# Patient Record
Sex: Female | Born: 1975 | Race: Black or African American | Hispanic: No | Marital: Married | Smoking: Never smoker
Health system: Southern US, Community
[De-identification: ages and names within clinical notes are randomized; demographics above are authoritative.]

## PROBLEM LIST (undated history)

## (undated) DIAGNOSIS — I499 Cardiac arrhythmia, unspecified: Secondary | ICD-10-CM

## (undated) DIAGNOSIS — M1990 Unspecified osteoarthritis, unspecified site: Secondary | ICD-10-CM

## (undated) DIAGNOSIS — I10 Essential (primary) hypertension: Secondary | ICD-10-CM

## (undated) DIAGNOSIS — R519 Headache, unspecified: Secondary | ICD-10-CM

## (undated) DIAGNOSIS — D649 Anemia, unspecified: Secondary | ICD-10-CM

## (undated) DIAGNOSIS — Z973 Presence of spectacles and contact lenses: Secondary | ICD-10-CM

## (undated) DIAGNOSIS — J189 Pneumonia, unspecified organism: Secondary | ICD-10-CM

## (undated) DIAGNOSIS — L309 Dermatitis, unspecified: Secondary | ICD-10-CM

## (undated) DIAGNOSIS — G8929 Other chronic pain: Secondary | ICD-10-CM

## (undated) DIAGNOSIS — T7840XA Allergy, unspecified, initial encounter: Secondary | ICD-10-CM

## (undated) DIAGNOSIS — T8859XA Other complications of anesthesia, initial encounter: Secondary | ICD-10-CM

## (undated) DIAGNOSIS — R51 Headache: Secondary | ICD-10-CM

## (undated) HISTORY — DX: Dermatitis, unspecified: L30.9

## (undated) HISTORY — DX: Headache, unspecified: R51.9

## (undated) HISTORY — DX: Other chronic pain: G89.29

## (undated) HISTORY — PX: WISDOM TOOTH EXTRACTION: SHX21

## (undated) HISTORY — DX: Allergy, unspecified, initial encounter: T78.40XA

## (undated) HISTORY — DX: Headache: R51

## (undated) HISTORY — DX: Anemia, unspecified: D64.9

---

## 1998-12-15 ENCOUNTER — Other Ambulatory Visit: Admission: RE | Admit: 1998-12-15 | Discharge: 1998-12-15 | Payer: Self-pay | Admitting: Obstetrics and Gynecology

## 1999-01-23 ENCOUNTER — Other Ambulatory Visit: Admission: RE | Admit: 1999-01-23 | Discharge: 1999-01-23 | Payer: Self-pay | Admitting: Obstetrics and Gynecology

## 1999-06-08 ENCOUNTER — Other Ambulatory Visit: Admission: RE | Admit: 1999-06-08 | Discharge: 1999-06-08 | Payer: Self-pay | Admitting: Obstetrics and Gynecology

## 1999-07-29 ENCOUNTER — Emergency Department (HOSPITAL_COMMUNITY): Admission: EM | Admit: 1999-07-29 | Discharge: 1999-07-29 | Payer: Self-pay | Admitting: Emergency Medicine

## 1999-11-08 ENCOUNTER — Other Ambulatory Visit: Admission: RE | Admit: 1999-11-08 | Discharge: 1999-11-08 | Payer: Self-pay | Admitting: Obstetrics and Gynecology

## 1999-12-14 ENCOUNTER — Encounter (INDEPENDENT_AMBULATORY_CARE_PROVIDER_SITE_OTHER): Payer: Self-pay

## 1999-12-14 ENCOUNTER — Other Ambulatory Visit: Admission: RE | Admit: 1999-12-14 | Discharge: 1999-12-14 | Payer: Self-pay | Admitting: Obstetrics and Gynecology

## 2000-03-07 ENCOUNTER — Other Ambulatory Visit: Admission: RE | Admit: 2000-03-07 | Discharge: 2000-03-07 | Payer: Self-pay | Admitting: Obstetrics and Gynecology

## 2000-11-04 HISTORY — PX: WISDOM TOOTH EXTRACTION: SHX21

## 2002-01-15 ENCOUNTER — Other Ambulatory Visit: Admission: RE | Admit: 2002-01-15 | Discharge: 2002-01-15 | Payer: Self-pay | Admitting: Gynecology

## 2002-08-10 ENCOUNTER — Inpatient Hospital Stay (HOSPITAL_COMMUNITY): Admission: AD | Admit: 2002-08-10 | Discharge: 2002-08-13 | Payer: Self-pay | Admitting: Gynecology

## 2002-09-07 ENCOUNTER — Other Ambulatory Visit: Admission: RE | Admit: 2002-09-07 | Discharge: 2002-09-07 | Payer: Self-pay | Admitting: Gynecology

## 2003-08-09 ENCOUNTER — Other Ambulatory Visit: Admission: RE | Admit: 2003-08-09 | Discharge: 2003-08-09 | Payer: Self-pay | Admitting: Gynecology

## 2004-03-26 ENCOUNTER — Encounter: Admission: RE | Admit: 2004-03-26 | Discharge: 2004-03-26 | Payer: Self-pay | Admitting: Internal Medicine

## 2004-04-03 ENCOUNTER — Encounter: Admission: RE | Admit: 2004-04-03 | Discharge: 2004-04-03 | Payer: Self-pay | Admitting: Internal Medicine

## 2005-05-21 ENCOUNTER — Ambulatory Visit: Payer: Self-pay | Admitting: Internal Medicine

## 2005-06-06 ENCOUNTER — Other Ambulatory Visit: Admission: RE | Admit: 2005-06-06 | Discharge: 2005-06-06 | Payer: Self-pay | Admitting: Gynecology

## 2006-11-07 ENCOUNTER — Other Ambulatory Visit: Admission: RE | Admit: 2006-11-07 | Discharge: 2006-11-07 | Payer: Self-pay | Admitting: Gynecology

## 2009-05-25 ENCOUNTER — Ambulatory Visit: Payer: Self-pay | Admitting: Women's Health

## 2009-05-25 ENCOUNTER — Other Ambulatory Visit: Admission: RE | Admit: 2009-05-25 | Discharge: 2009-05-25 | Payer: Self-pay | Admitting: Gynecology

## 2009-05-25 ENCOUNTER — Encounter: Payer: Self-pay | Admitting: Women's Health

## 2010-05-04 LAB — HM PAP SMEAR: HM Pap smear: NORMAL

## 2010-09-25 ENCOUNTER — Inpatient Hospital Stay (HOSPITAL_COMMUNITY): Admission: AD | Admit: 2010-09-25 | Discharge: 2010-09-29 | Payer: Self-pay | Admitting: Obstetrics and Gynecology

## 2010-09-26 ENCOUNTER — Encounter: Payer: Self-pay | Admitting: Obstetrics and Gynecology

## 2010-09-28 ENCOUNTER — Encounter (INDEPENDENT_AMBULATORY_CARE_PROVIDER_SITE_OTHER): Payer: Self-pay | Admitting: Obstetrics and Gynecology

## 2011-01-16 LAB — COMPREHENSIVE METABOLIC PANEL
ALT: 34 U/L (ref 0–35)
AST: 54 U/L — ABNORMAL HIGH (ref 0–37)
Albumin: 2.4 g/dL — ABNORMAL LOW (ref 3.5–5.2)
Albumin: 2.4 g/dL — ABNORMAL LOW (ref 3.5–5.2)
Alkaline Phosphatase: 105 U/L (ref 39–117)
Alkaline Phosphatase: 128 U/L — ABNORMAL HIGH (ref 39–117)
Alkaline Phosphatase: 98 U/L (ref 39–117)
BUN: 1 mg/dL — ABNORMAL LOW (ref 6–23)
BUN: 1 mg/dL — ABNORMAL LOW (ref 6–23)
BUN: 4 mg/dL — ABNORMAL LOW (ref 6–23)
CO2: 23 mEq/L (ref 19–32)
CO2: 27 mEq/L (ref 19–32)
Calcium: 8.4 mg/dL (ref 8.4–10.5)
Chloride: 101 mEq/L (ref 96–112)
Chloride: 103 mEq/L (ref 96–112)
Chloride: 98 mEq/L (ref 96–112)
Creatinine, Ser: 0.47 mg/dL (ref 0.4–1.2)
GFR calc Af Amer: >60 mL/min (ref >60)
GFR calc non Af Amer: >60 mL/min (ref >60)
GFR calc non Af Amer: >60 mL/min (ref >60)
Glucose, Bld: 132 mg/dL — ABNORMAL HIGH (ref 70–99)
Glucose, Bld: 90 mg/dL (ref 70–99)
Potassium: 2.4 mEq/L — CL (ref 3.5–5.1)
Potassium: 2.9 mEq/L — ABNORMAL LOW (ref 3.5–5.1)
Potassium: 3.1 mEq/L — ABNORMAL LOW (ref 3.5–5.1)
Sodium: 137 mEq/L (ref 135–145)
Sodium: 140 mEq/L (ref 135–145)
Total Bilirubin: 0.8 mg/dL (ref 0.3–1.2)
Total Bilirubin: 0.8 mg/dL (ref 0.3–1.2)
Total Bilirubin: 1.2 mg/dL (ref 0.3–1.2)
Total Protein: 5 g/dL — ABNORMAL LOW (ref 6.0–8.3)

## 2011-01-16 LAB — CREATININE CLEARANCE, URINE, 24 HOUR
Collection Interval-CRCL: 24 hours
Creatinine Clearance: 193 mL/min — ABNORMAL HIGH (ref 75–115)
Creatinine, Urine: 59.1 mg/dL
Creatinine: 0.5 mg/dL (ref 0.4–1.2)

## 2011-01-16 LAB — CBC
HCT: 31.1 % — ABNORMAL LOW (ref 36.0–46.0)
HCT: 32.3 % — ABNORMAL LOW (ref 36.0–46.0)
Hemoglobin: 10.4 g/dL — ABNORMAL LOW (ref 12.0–15.0)
Hemoglobin: 10.8 g/dL — ABNORMAL LOW (ref 12.0–15.0)
Hemoglobin: 11.9 g/dL — ABNORMAL LOW (ref 12.0–15.0)
MCH: 29.4 pg (ref 26.0–34.0)
MCH: 29.4 pg (ref 26.0–34.0)
MCHC: 33.5 g/dL (ref 30.0–36.0)
MCHC: 33.5 g/dL (ref 30.0–36.0)
MCHC: 33.8 g/dL (ref 30.0–36.0)
MCV: 87.9 fL (ref 78.0–100.0)
MCV: 88 fL (ref 78.0–100.0)
MCV: 88 fL (ref 78.0–100.0)
Platelets: 111 10*3/uL — ABNORMAL LOW (ref 150–400)
Platelets: 78 10*3/uL — ABNORMAL LOW (ref 150–400)
Platelets: 80 10*3/uL — ABNORMAL LOW (ref 150–400)
RBC: 3.45 MIL/uL — ABNORMAL LOW (ref 3.87–5.11)
RBC: 3.46 MIL/uL — ABNORMAL LOW (ref 3.87–5.11)
RDW: 16.4 % — ABNORMAL HIGH (ref 11.5–15.5)
WBC: 10.5 10*3/uL (ref 4.0–10.5)
WBC: 6.1 10*3/uL (ref 4.0–10.5)

## 2011-01-16 LAB — LACTATE DEHYDROGENASE: LDH: 169 U/L (ref 94–250)

## 2011-01-16 LAB — PROTEIN, URINE, 24 HOUR: Protein, 24H Urine: 306 mg/d — ABNORMAL HIGH (ref 50–100)

## 2011-01-16 LAB — MRSA PCR SCREENING: MRSA by PCR: NEGATIVE

## 2011-01-16 LAB — ABO/RH: ABO/RH(D): A POS

## 2011-03-22 NOTE — Discharge Summary (Signed)
   NAME:  Carmen Simmons, Carmen Simmons                        ACCOUNT NO.:  [id number]   MEDICAL RECORD NO.:  [id number]                   PATIENT TYPE:  INP   LOCATION:  9325                                 FACILITY:  [facility]   PHYSICIAN:  Juan H. Lily Peer, M.D.             DATE OF BIRTH:  [birth year]   DATE OF ADMISSION:  08/10/2002  DATE OF DISCHARGE:  08/13/2002                                 DISCHARGE SUMMARY   DISCHARGE DIAGNOSES:  1. Intrauterine pregnancy at term.  2. Spontaneous onset of labor.   PROCEDURE:  Normal spontaneous vaginal delivery of viable infant over  midline episiotomy with second degree.   HISTORY OF PRESENT ILLNESS:  The patient is a 35 year old gravida 3, para 1-  0-1-1, LMP October 20, 2001, [facility] August 11, 2002.  Prenatal risk factors  include history of cryo surgery and a LEEP.   LABORATORIES:  Blood type A+.  Antibody screen negative.  RPR, HBSAG, HIV  nonreactive.  Sickle cell negative.   HOSPITAL COURSE:  The patient was admitted August 11, 2002 with spontaneous  onset of labor.  She progressed to complete dilatation.  Delivered an Apgar  8/9 female infant weighing 8 pounds 13 ounces over midline episiotomy with  repair of second degree laceration.  Postpartum course patient remained  afebrile, had no difficulty voiding.  Was able to be discharged in  satisfactory condition on her second postpartum day.  CBC:  Hematocrit 30.7,  hemoglobin 10.2, WBC 9.6, platelets 106,000.   DISPOSITION:  Follow up in six weeks.  Continue prenatal vitamins and iron.  Motrin and Tylox for pain.     Elwyn Lade . Hancock, N.P.                Gaetano Hawthorne. Lily Peer, M.D.    MKH/MEDQ  D:  09/06/2002  T:  09/07/2002  Job:  [id number]

## 2011-03-22 NOTE — H&P (Signed)
NAME:  Carmen Simmons, Carmen Simmons                        ACCOUNT NO.:  [id number]   MEDICAL RECORD NO.:  [id number]                   PATIENT TYPE:  INP   LOCATION:  9198                                 FACILITY:  [facility]   PHYSICIAN:  Timothy P. Fontaine, M.D.           DATE OF BIRTH:  1976/09/01   DATE OF ADMISSION:  08/10/2002  DATE OF DISCHARGE:                                HISTORY & PHYSICAL   CHIEF COMPLAINT:  Decreased amniotic fluid, pregnancy at term, large for  gestational age.   HISTORY OF PRESENT ILLNESS:  A 35 year old G80, P31, AB1 female at 5 to 59  weeks' gestation who presented complaining of decreased fetal movement.  The  patient underwent a nonstress test which was reactive, amniotic fluid check  which showed a less than third percentile amniotic fluid index at 5.9 cm and  estimated fetal weight at 4253 grams.  Of significance, the patient's  pregnancy has been uncomplicated to date.  Underwent vaginal delivery in  1999 of an 8 pound 13 ounce female, vaginal delivery with a reported  precipitous delivery.  The patient's prenatal course has been uncomplicated  to date.   PAST MEDICAL HISTORY:  None.   PAST SURGICAL HISTORY:  1. A cryosurgery of the cervix.  2. LEEP of the cervix.   ALLERGIES:  No known allergies.   REVIEW OF SYSTEMS:  Noncontributory.   SOCIAL HISTORY:  Noncontributory.   ADMISSION PHYSICAL EXAMINATION:  ABDOMEN:  Gravid, vertex fetus.  PELVIC:  Cervix tight fingertip 70%, minus 2 station, vertex presentation.   ASSESSMENT:  A 35 year old gravida 3, para 1, abortion 1 female 53 to [redacted]  weeks gestation, decreased fetal movement with reactive nonstress test, low  amniotic fluid index, estimated fetal weight at 4200 grams.  The patient has  prior delivery of 8 pound 13 ounce fetus with a reported precipitous  delivery that the doctor almost did not make it there in time.  I reviewed  with the patient and her husband my concern as far as fetal weight  and the  issues of shoulder dystocia.  The options of primary cesarean section versus  attempted vaginal delivery were reviewed, and I discussed in detail the  issues of shoulder dystocia to include the potential for devastating  outcomes.  The patient and her partner feel very strongly that they want an  attempt at a vaginal delivery, which I think given her prudent pelvis,  rapidity of past delivery and adequacy on physical exam is reasonable  although the patient understands that there are no guarantees and certainly  that the first indication of shoulder dystocia may be at the time of  delivery of the head.  I offered them cesarean section and they declined  accepting the risks of shoulder dystocia.  The patient and her partner also  understand that no attempts at assisted vaginal delivery would be made with  vacuum or forceps if  protraction or  dystocia is suspected, then we will proceed with cesarean  section at that time and they accept that plan.  Again, they clearly  understand the risks and the potential long-term sequelae of shoulder  dystocia, understand and accept this.                                               Timothy P. Audie Box, M.D.    TPF/MEDQ  D:  08/10/2002  T:  08/10/2002  Job:  [id number]

## 2011-06-06 ENCOUNTER — Ambulatory Visit: Payer: Self-pay | Admitting: Medical

## 2011-06-06 ENCOUNTER — Ambulatory Visit (INDEPENDENT_AMBULATORY_CARE_PROVIDER_SITE_OTHER): Payer: Commercial Managed Care - PPO | Admitting: Medical

## 2011-06-06 ENCOUNTER — Encounter: Payer: Self-pay | Admitting: Medical

## 2011-06-06 VITALS — BP 122/88 | HR 80 | Temp 97.8°F | Ht 70.0 in | Wt 215.5 lb

## 2011-06-06 DIAGNOSIS — M25561 Pain in right knee: Secondary | ICD-10-CM

## 2011-06-06 DIAGNOSIS — K429 Umbilical hernia without obstruction or gangrene: Secondary | ICD-10-CM

## 2011-06-06 DIAGNOSIS — M26609 Unspecified temporomandibular joint disorder, unspecified side: Secondary | ICD-10-CM

## 2011-06-06 DIAGNOSIS — M25569 Pain in unspecified knee: Secondary | ICD-10-CM

## 2011-06-06 NOTE — Progress Notes (Signed)
Subjective:   HPI  Carmen Simmons is a 35 y.o. female who presents as a new patient for multiple c/o.   She notes right knee pain and swelling x 1+ years.  Occasional the knee "goes out" on her. Worse with walking, standing in general. Denies hx/o injury or trauma. Using nothing for symptoms.  Denies remote hx/o injury or pop.  Using Absorbene junior topically without relief.   She c/o left ear pain x 1 wk.  Has hx/o ear infections.  Denies other URI symptoms.  She c/o possible umbiciluus hernia.  Occasionally has some mild discomfort at the umiblicius.  No other aggravating or relieving factors.  No other c/o.  The following portions of the patient's history were reviewed and updated as appropriate: allergies, current medications, past family history, past medical history, past social history, past surgical history and problem list.   Review of Systems Constitutional: denies fever, chills, sweats, unexpected weight change, anorexia, fatigue Allergy: no congestion, sneezing Dermatology: denies rash ENT: no runny nose, sore throat, hoarseness, sinus pain Cardiology: denies chest pain, palpitations, edema Respiratory: denies cough, shortness of breath, wheezing Gastroenterology: denies abdominal pain, nausea, vomiting, diarrhea, constipation Urology: denies dysuria, difficulty urinating, hematuria, urinary frequency, urgency Neurology: no headache, weakness, tingling, numbness     Objective:   Physical Exam  General appearance: alert, no distress, WD/WN, black female, overweight Skin: unremarkable HEENT: normocephalic, sclerae anicteric, PERRLA, EOMi, TMs normal, tender along left TMJ, nares patent, no discharge or erythema, pharynx normal Oral cavity: MMM, no lesions Neck: supple, no lymphadenopathy, no thyromegaly, no masses Heart: RRR, normal S1, S2, no murmurs Lungs: CTA bilaterally, no wheezes, rhonchi, or rales Abdomen: +bs, soft, 1.5 cm round umbilicus hernia, reducible, mildly  tender, otherwise non tender, non distended, no masses, no hepatomegaly, no splenomegaly Musculoskeletal:right knee tender along joint line, particularly anteromedial joint line, anterior drawer with slight laxity, pain with Mcmurray.  Otherwise no right knee laxity or pain with special tests.  Otherwise, LE normal ROM, non tender, no swelling, no obvious deformity Extremities: no edema, no cyanosis, no clubbing Pulses: 2+ symmetric, upper and lower extremities, normal cap refill Neurological: UE and LE normal strength and DTRs   Assessment :    Encounter Diagnoses  Name Primary?  . TMJ (temporomandibular joint syndrome) Yes  . Right knee pain   . Hernia, umbilical       Plan:   TMJ syndrome - advised soft foods/liquid diet for next few days, jaw rest, avoid heavy chewing, can use ice pack to the left TMJ, OTC NSAID  Right knee pain - exam seems suggestive of ACL laxity.  Refer to ortho for further eval and management.  Hernia, umbilical - discussed nature of umbilical hernias.  For now we will use watch and wait approach.  If worsening, consider general surgery consult.   Recheck in a few weeks after ortho eval.

## 2011-06-07 ENCOUNTER — Encounter: Payer: Self-pay | Admitting: Medical

## 2011-09-24 ENCOUNTER — Ambulatory Visit (INDEPENDENT_AMBULATORY_CARE_PROVIDER_SITE_OTHER): Payer: Commercial Managed Care - PPO | Admitting: Medical

## 2011-09-24 ENCOUNTER — Encounter: Payer: Self-pay | Admitting: Medical

## 2011-09-24 VITALS — BP 120/80 | HR 80 | Temp 98.0°F | Resp 16 | Wt 213.0 lb

## 2011-09-24 DIAGNOSIS — J069 Acute upper respiratory infection, unspecified: Secondary | ICD-10-CM | POA: Insufficient documentation

## 2011-09-24 DIAGNOSIS — J329 Chronic sinusitis, unspecified: Secondary | ICD-10-CM | POA: Insufficient documentation

## 2011-09-24 MED ORDER — AMOXICILLIN 500 MG PO TABS: ORAL_TABLET | ORAL | Status: DC

## 2011-09-24 NOTE — Patient Instructions (Signed)

## 2011-09-24 NOTE — Progress Notes (Signed)
Subjective:   HPI  Carmen Simmons is a 35 y.o. female who presents with few day hx/o sore throat, glands swollen, congestion in head, and she notes that she is prone to sinus infection.  Has some cough.   Orangish mucous from cough.  Using some [facility] Theraflu and cough drops.  Son recently had bronchitis.  She is a nonsmoker.  No other aggravating or relieving factors.    No other c/o.  The following portions of the patient's history were reviewed and updated as appropriate: allergies, current medications, past family history, past medical history, past social history and past surgical history.  Past Medical History  Diagnosis Date  . Anemia   . Allergy   . Eczema   . Chronic headache     Review of Systems Constitutional: -fever, -chills, -sweats, -unexpected -weight change,-fatigue ENT: -runny nose, -ear pain, +sore throat Cardiology:  -chest pain, -palpitations, -edema Respiratory: +cough, +shortness of breath, -wheezing Gastroenterology: -abdominal pain, -nausea, -vomiting, -diarrhea, -constipation Hematology: -bleeding or bruising problems Musculoskeletal: -arthralgias, -myalgias, -joint swelling, -back pain Ophthalmology: -vision changes Urology: -dysuria, -difficulty urinating, -hematuria, -urinary frequency, -urgency Neurology: +headache, -weakness, -tingling, -numbness    Objective:   Filed Vitals:   09/24/11 0814  BP: 120/80  Pulse: 80  Temp: 98 F (36.7 C)  Resp: 16    General appearance: Alert, WD/WN, no distress                             Skin: warm, no rash                           Head: +mild maxillary and frontal sinus tenderness,                            Eyes: conjunctiva normal, corneas clear, PERRLA                            Ears: pearly TMs, external ear canals normal                          Nose: septum midline, turbinates swollen, with erythema and clear discharge             Mouth/throat: MMM, tongue normal, mild pharyngeal erythema              Neck: supple, no adenopathy, no thyromegaly, nontender                          Heart: RRR, normal S1, S2, no murmurs                         Lungs: CTA bilaterally, no wheezes, rales, or rhonchi      Assessment and Plan:   Encounter Diagnoses  Name Primary?  . URI (upper respiratory infection) Yes  . Sinusitis    Discussed symptomatic relief, nasal saline, and advised that current eval suggests viral etiology.   If much worse in 3-5 days, particularly fever, worse thick green brown discharge, worse sinus pressure/teeth pain, then can consider beginning Amoxicillin.  Script printed.  For now though, advised to treat as discussed for viral etiology.  Call or return if worse or not improving.

## 2012-01-03 DIAGNOSIS — N930 Postcoital and contact bleeding: Secondary | ICD-10-CM | POA: Insufficient documentation

## 2012-03-13 ENCOUNTER — Ambulatory Visit (INDEPENDENT_AMBULATORY_CARE_PROVIDER_SITE_OTHER): Payer: Commercial Managed Care - PPO | Admitting: Medical

## 2012-03-13 ENCOUNTER — Encounter: Payer: Self-pay | Admitting: Medical

## 2012-03-13 VITALS — BP 110/80 | HR 88 | Temp 98.2°F | Resp 16 | Wt 220.0 lb

## 2012-03-13 DIAGNOSIS — J329 Chronic sinusitis, unspecified: Secondary | ICD-10-CM

## 2012-03-13 MED ORDER — MOMETASONE FUROATE 50 MCG/ACT NA SUSP: 2.0000 | Freq: Every day | NASAL | Status: DC

## 2012-03-13 MED ORDER — AMOXICILLIN 875 MG PO TABS: 875.0000 mg | ORAL_TABLET | Freq: Two times a day (BID) | ORAL | Status: AC

## 2012-03-13 NOTE — Patient Instructions (Signed)

## 2012-03-13 NOTE — Progress Notes (Signed)
Subjective:  Carmen Simmons is a 36 y.o. female who presents for massive sinus pressure in head, bad cold symptoms, lots of post nasal drainage, and irritated throat, bad x all week.  She notes low grade fever, some ear pressure, feels weird, some popping.  Some cough due to drainage.  Lot of phlegm in throat.  Using some sinus and congestion tablets, but last 3 days not helping.   +sick contacts - son, 2 wk ago with strep.  No other aggravating or relieving factors.  No other c/o.  Past Medical History  Diagnosis Date  . Anemia   . Allergy   . Eczema   . Chronic headache    ROS  GI:no NVD, otherwise as noted in HPI   Objective:   Filed Vitals:   03/13/12 1510  BP: 110/80  Pulse: 88  Temp: 98.2 F (36.8 C)  Resp: 16    General appearance: Alert, WD/WN, no distress                             Skin: warm, no rash                           Head: +frontal sinus tenderness,                            Eyes: conjunctiva normal, corneas clear, PERRLA                            Ears: pearly TMs, external ear canals normal                          Nose: septum midline, turbinates swollen, R>L, with erythema and clear discharge             Mouth/throat: MMM, tongue normal, mild pharyngeal erythema                           Neck: supple, no adenopathy, no thyromegaly, nontender                          Heart: RRR, normal S1, S2, no murmurs                         Lungs: CTA bilaterally, no wheezes, rales, or rhonchi      Assessment and Plan:   Encounter Diagnosis  Name Primary?  . Sinusitis Yes   Prescription given for Amoxicillin, sample Nasonex given.  Can use OTC Robitussin for congestion.  Tylenol or Ibuprofen OTC for fever and malaise.  Discussed symptomatic relief, nasal saline, and call or return if worse or not improving in 2-3 days.

## 2012-04-15 ENCOUNTER — Ambulatory Visit (INDEPENDENT_AMBULATORY_CARE_PROVIDER_SITE_OTHER): Payer: Commercial Managed Care - PPO | Admitting: Medical

## 2012-04-15 ENCOUNTER — Encounter: Payer: Self-pay | Admitting: Medical

## 2012-04-15 VITALS — BP 120/80 | HR 64 | Temp 98.4°F | Resp 14 | Wt 214.0 lb

## 2012-04-15 DIAGNOSIS — M76899 Other specified enthesopathies of unspecified lower limb, excluding foot: Secondary | ICD-10-CM

## 2012-04-15 DIAGNOSIS — M7050 Other bursitis of knee, unspecified knee: Secondary | ICD-10-CM

## 2012-04-15 MED ORDER — NAPROXEN SODIUM ER 500 MG PO TB24: 500.0000 mg | ORAL_TABLET | Freq: Every day | ORAL | Status: DC

## 2012-04-15 NOTE — Progress Notes (Signed)
Subjective: Here for c/o right knee pain.  I saw her last year for right knee pain and she ended up seeing ortho for evaluation, diagnosed with patellofemoral syndrome, was referred for physical therapy.   She went a few times and things seemed to get better.  In general she has not been exercising.  She was playing on the floor on her knees with her twin 1.5 year olds Saturday, but denies a pop, injury, or trauma.  She started seeing a knot and swelling in the right knee on Saturday.  She went for a run Sunday without much problem.  The knot didn't change thought.  She went for run yesterday too.  She initially had swelling and knot, but yesterday started getting pain too.  She denies fall, injury, trauma.  She denies swelling, redness, no numbness or tingling.     Past Medical History  Diagnosis Date  . Anemia   . Allergy   . Eczema   . Chronic headache    Objective: Gen: wd, wn, nad, AA female Skin: no erythema or ecchymosis MSK: obvious raised linear mass along the right anteromedial knee and seems to run along the sartorius muscle suggesting inflamed tendon.  The anterior knee along the patellar tendon is swollen, tender, and there is a small effusion.  Exam suggest bursitis.  Otherwise normal knee ROM, no other deformity or laxity.  Rest of LE exam unremarkable. Pulses: LE normal pulses Neuro: normal strength and sensation of LE   Assessment: Encounter Diagnosis  Name Primary?  . Patellar bursitis Yes    Plan:  Discussed case with Dr. Lynelle Doctor who also examined the patient.  Discussed diagnosis, causes, treatment.  Advised RICE - rest, ice, compression with knee sleeve, and ice. Begin samples of Naprelan once daily.  If not improving in 7-10 days, then call or return.  reviewed ortho records from last year regarding right patella femoral syndrome.

## 2012-06-08 ENCOUNTER — Telehealth: Payer: Self-pay | Admitting: Medical

## 2012-06-08 NOTE — Telephone Encounter (Signed)
Take some form of antiinflammatory tonight such as Aleve or Ibuprofen, elevate the leg, and use ice pack for 20 minuets at at time.  Work her in [employer] account executive.  Sounds like she may ultimately need to see ortho.

## 2013-01-06 ENCOUNTER — Ambulatory Visit (INDEPENDENT_AMBULATORY_CARE_PROVIDER_SITE_OTHER): Payer: Commercial Managed Care - PPO | Admitting: Medical

## 2013-01-06 ENCOUNTER — Encounter: Payer: Self-pay | Admitting: Medical

## 2013-01-06 VITALS — BP 120/80 | HR 68 | Temp 98.3°F | Resp 16 | Wt 206.0 lb

## 2013-01-06 DIAGNOSIS — R109 Unspecified abdominal pain: Secondary | ICD-10-CM

## 2013-01-06 DIAGNOSIS — M549 Dorsalgia, unspecified: Secondary | ICD-10-CM

## 2013-01-06 DIAGNOSIS — IMO0002 Reserved for concepts with insufficient information to code with codable children: Secondary | ICD-10-CM

## 2013-01-06 LAB — POCT URINALYSIS DIPSTICK
Bilirubin, UA: NEGATIVE
Leukocytes, UA: NEGATIVE
Nitrite, UA: NEGATIVE
Urobilinogen, UA: NEGATIVE
pH, UA: 7

## 2013-01-06 MED ORDER — OMEPRAZOLE 40 MG PO CPDR: DELAYED_RELEASE_CAPSULE | ORAL | Status: DC

## 2013-01-06 NOTE — Patient Instructions (Signed)
Begin Prilosec/Omeprazole once daily 45 minutes prior to exercise to help epigastric pain and abdominal pain and heartburn symptom  Continue the probiotic, make sure you are getting fiber in the diet such as from whole grain, fruits, plenty of water daily, and consider a fiber supplement such as psyllium, flax seed or chia seed.  You can also get OTC fiber supplement tablets.    Consider Miralax powder, mixing 1 scoop in a beverage daily.  This is also available OTC.    If your symptoms persist, then we will consider imaging.  However, by the time you see gynecology, they can also help [place] address this if they decide to image your abdomen.

## 2013-01-06 NOTE — Progress Notes (Signed)
Subjective:  Carmen Simmons is a 37 y.o. female who presents with abdominal pain, thinks she has a hernia left of belly button.  She notes localized pain left of umbilicus, but no bulging.   Sometimes pain worse with standing or exercise.  Has had off and on similar since 2003, thinks prior doctor said she may have hernia.  She has been wearing a girdle support.   Denies any recent imaging of her abdomen.  Denies fever, weight loss or gain, nausea, vomiting, diarrhea, constipation, no blood or mucous in stool, no GU symptoms.  She does report at times discomfort after intercourse, occasionally has blood spotting or tissue like material from vagina after sex.  Has mirena.   No vaginal discharge.  Been bloated with gas makes this worse.    Has some heartburn, epigastric pain at times.  No other aggravating or relieving factors.    No other c/o.  The following portions of the patient's history were reviewed and updated as appropriate: allergies, current medications, past family history, past medical history, past social history, past surgical history and problem list.  ROS Otherwise as in subjective above  Past Medical History  Diagnosis Date  . Anemia   . Allergy   . Eczema   . Chronic headache     Objective: Physical Exam  Vital signs reviewed  General appearance: alert, no distress, WD/WN, AA female Oral cavity: MMM, no lesions Neck: supple, no lymphadenopathy, no thyromegaly, no masses Heart: RRR, normal S1, S2, no murmurs Lungs: CTA bilaterally, no wheezes, rhonchi, or rales Back : nontender, normal ROM Abdomen: +bs, soft, generalized tenderness, no obvious hernia, non distended, no masses, no hepatomegaly, no splenomegaly Pulses: 2+ radial pulses, 2+ pedal pulses, normal cap refill Ext: no edema   Assessment: Encounter Diagnoses  Name Primary?  . Abdominal pain, unspecified site Yes  . Back pain   . Dyspareunia     Plan: Discussed symptoms, concerns.   differential can  include GERD, gynecological problem, cholecystitis, constipation, mass, adhesions, etc.  She is tender throughout.  She has some possible GERD symptoms.  Discussed treatment and lifestyle changes to help with GERD and constipation including avoidance of GERD triggers in diet, increase water and fiber intake, begin trial of Omeprazole, and if not improving in 1-2 wk, consider recheck and also f/u with gynecologist regarding dyspareunia.   Follow up: 2wk

## 2013-01-19 ENCOUNTER — Other Ambulatory Visit: Payer: Self-pay | Admitting: Physician Assistant

## 2013-01-19 DIAGNOSIS — R109 Unspecified abdominal pain: Secondary | ICD-10-CM

## 2013-01-21 ENCOUNTER — Ambulatory Visit
Admission: RE | Admit: 2013-01-21 | Discharge: 2013-01-21 | Disposition: A | Discharge status: Home / Self Care | Payer: Commercial Managed Care - PPO | Source: Ambulatory Visit | Attending: Physician Assistant | Admitting: Physician Assistant

## 2013-01-21 DIAGNOSIS — R109 Unspecified abdominal pain: Secondary | ICD-10-CM

## 2013-01-28 ENCOUNTER — Other Ambulatory Visit: Payer: Self-pay | Admitting: Family Medicine

## 2013-01-30 LAB — PAP IG, CT-NG, RFX HPV ASCU
Chlamydia Probe Amp: NEGATIVE
GC Probe Amp: NEGATIVE

## 2013-02-01 DIAGNOSIS — E669 Obesity, unspecified: Secondary | ICD-10-CM | POA: Insufficient documentation

## 2013-02-08 ENCOUNTER — Ambulatory Visit (INDEPENDENT_AMBULATORY_CARE_PROVIDER_SITE_OTHER): Payer: Commercial Managed Care - PPO | Admitting: Surgery

## 2013-02-08 ENCOUNTER — Encounter (INDEPENDENT_AMBULATORY_CARE_PROVIDER_SITE_OTHER): Payer: Self-pay | Admitting: Surgery

## 2013-02-08 ENCOUNTER — Other Ambulatory Visit (INDEPENDENT_AMBULATORY_CARE_PROVIDER_SITE_OTHER): Payer: Self-pay

## 2013-02-08 VITALS — BP 128/86 | HR 68 | Resp 14 | Ht 70.0 in | Wt 210.2 lb

## 2013-02-08 DIAGNOSIS — M6200 Separation of muscle (nontraumatic), unspecified site: Secondary | ICD-10-CM

## 2013-02-08 DIAGNOSIS — M6208 Separation of muscle (nontraumatic), other site: Secondary | ICD-10-CM

## 2013-02-08 DIAGNOSIS — K429 Umbilical hernia without obstruction or gangrene: Secondary | ICD-10-CM | POA: Insufficient documentation

## 2013-02-08 NOTE — Progress Notes (Signed)
Patient ID: Carmen Simmons, female   DOB: 06-07-1976, 37 y.o.   MRN: [id number]  No chief complaint on file.   HPI Carmen Simmons is a 37 y.o. female.  Patient sent at the request of Kristian Covey PA for abdominal pain. She's had progressive dull abdominal pain above her umbilicus made worse with exertion and improved by wearing an abdominal support. Pain gets worse at the end of day and is becoming more common. She has a history of an umbilical hernia since 2005. She gave birth to twins 2-1/2 years ago. No nausea, vomiting or diarrhea noted. No food intolerance noted. HPI  Past Medical History  Diagnosis Date  . Anemia   . Allergy   . Eczema   . Chronic headache     Past Surgical History  Procedure Laterality Date  . Cesarean section    . Wisdom tooth extraction      Family History  Problem Relation Age of Onset  . Hypertension Mother   . Hypertension Mother     Social History History  Substance Use Topics  . Smoking status: Never Smoker   . Smokeless tobacco: Never Used  . Alcohol Use: 0.0 oz/week    .5 drink(s) per week     Comment: less than 1 a week    No Known Allergies  Current Outpatient Prescriptions  Medication Sig Dispense Refill  . Levonorgestrel (MIRENA IU) by Intrauterine route.        . mometasone (NASONEX) 50 MCG/ACT nasal spray Place 2 sprays into the nose daily.  17 g  12  . Multiple Vitamin (MULTIVITAMIN) tablet Take 1 tablet by mouth daily.        Marland Kitchen omeprazole (PRILOSEC) 40 MG capsule Take 1 tablet 45 minutes prior to breakfast daily  30 capsule  0   No current facility-administered medications for this visit.    Review of Systems Review of Systems  Constitutional: Negative.   HENT: Negative.   Eyes: Negative.   Respiratory: Negative.   Cardiovascular: Negative.   Gastrointestinal: Positive for abdominal pain.  Endocrine: Negative.   Genitourinary: Negative.   Allergic/Immunologic: Negative.   Neurological: Negative.   Hematological:  Negative.   Psychiatric/Behavioral: Negative.     Blood pressure 128/86, pulse 68, resp. rate 14, height 5\' 10"  (1.778 m), weight 210 lb 3.2 oz (95.346 kg).  Physical Exam Physical Exam  Constitutional: She is oriented to person, place, and time. She appears well-developed and well-nourished.  HENT:  Head: Normocephalic and atraumatic.  Eyes: EOM are normal. Pupils are equal, round, and reactive to light.  Neck: Normal range of motion. Neck supple.  Cardiovascular: Normal rate and regular rhythm.   Pulmonary/Chest: Effort normal and breath sounds normal.  Abdominal: Soft. Bowel sounds are normal. She exhibits no distension. There is tenderness.    Musculoskeletal: Normal range of motion.  Neurological: She is alert and oriented to person, place, and time.  Skin: Skin is warm and dry.  Psychiatric: She has a normal mood and affect. Her behavior is normal. Thought content normal.    Data Reviewed ABDOMINAL CT WITH CONTRAST  Routine spiral CT of the abdomen was performed. 100 cc Omnipaque intravenous contrast and oral contrast were administered.  The abdominal parenchymal organs are normal in appearance. There is no evidence of masses, adenopathy, inflammatory process, or abnormal fluid collections. Very small umbilical hernia measures 1.3 cm wide containing fat only.  IMPRESSION  1. Minimal umbilical hernia containing fat only.  2. Otherwise normal.  PELVIC CT  WITH CONTRAST  Routine spiral CT of the pelvis was performed. Omnipaque intravenous contrast and oral contrast were administered.  The pelvic structures are normal in appearance. There is no evidence of masses, adenopathy, inflammatory process, or abnormal fluid collections.  IMPRESSION  Normal pelvis CT.   Assessment    Diastases recti with pain  Small umbilical hernia present since 2005 nontender    Plan    Need plastic surgery evaluation since she is having continued abdominal discomfort which I feel is from her  rectus diastases. Her hernia small and nontender. I doubt this is causing her discomfort since most of her pain is superior to this area in the area of diastases. She is wearing a support which helps. We'll get plastic surgery input and hopefully go from there. The hernia small to be repaired primarily.       Priscila Bean A. 02/08/2013, 11:02 AM

## 2013-02-08 NOTE — Patient Instructions (Signed)
Will refer to plastic surgery for opinion on diastasis recti.  Small umbilical hernia seems unchanged compared to 2005.

## 2013-02-17 ENCOUNTER — Telehealth (INDEPENDENT_AMBULATORY_CARE_PROVIDER_SITE_OTHER): Payer: Self-pay | Admitting: General Surgery

## 2013-02-17 NOTE — Telephone Encounter (Signed)
Spoke with patient she is aware of appt with Dr Kelly Splinter 03/02/13 845 am

## 2013-03-26 DIAGNOSIS — M6208 Separation of muscle (nontraumatic), other site: Secondary | ICD-10-CM | POA: Insufficient documentation

## 2013-08-18 ENCOUNTER — Other Ambulatory Visit: Payer: Self-pay | Admitting: Physician Assistant

## 2013-08-18 ENCOUNTER — Ambulatory Visit
Admission: RE | Admit: 2013-08-18 | Discharge: 2013-08-18 | Disposition: A | Discharge status: Home / Self Care | Payer: Commercial Managed Care - PPO | Source: Ambulatory Visit | Attending: Physician Assistant | Admitting: Physician Assistant

## 2013-08-18 DIAGNOSIS — R52 Pain, unspecified: Secondary | ICD-10-CM

## 2013-09-02 ENCOUNTER — Other Ambulatory Visit: Payer: Self-pay | Admitting: Family Medicine

## 2013-09-02 DIAGNOSIS — M25561 Pain in right knee: Secondary | ICD-10-CM

## 2013-09-06 ENCOUNTER — Other Ambulatory Visit: Payer: Commercial Managed Care - PPO

## 2015-06-03 ENCOUNTER — Emergency Department (HOSPITAL_COMMUNITY): Payer: Commercial Managed Care - PPO

## 2015-06-03 ENCOUNTER — Encounter (HOSPITAL_COMMUNITY): Payer: Self-pay

## 2015-06-03 ENCOUNTER — Emergency Department (HOSPITAL_COMMUNITY)
Admission: EM | Admit: 2015-06-03 | Discharge: 2015-06-03 | Disposition: A | Discharge status: Home / Self Care | Payer: Commercial Managed Care - PPO | Attending: Emergency Medicine | Admitting: Emergency Medicine

## 2015-06-03 DIAGNOSIS — Z3202 Encounter for pregnancy test, result negative: Secondary | ICD-10-CM | POA: Insufficient documentation

## 2015-06-03 DIAGNOSIS — G8929 Other chronic pain: Secondary | ICD-10-CM | POA: Insufficient documentation

## 2015-06-03 DIAGNOSIS — R197 Diarrhea, unspecified: Secondary | ICD-10-CM | POA: Insufficient documentation

## 2015-06-03 DIAGNOSIS — N83201 Unspecified ovarian cyst, right side: Secondary | ICD-10-CM

## 2015-06-03 DIAGNOSIS — Z862 Personal history of diseases of the blood and blood-forming organs and certain disorders involving the immune mechanism: Secondary | ICD-10-CM | POA: Diagnosis not present

## 2015-06-03 DIAGNOSIS — Z79899 Other long term (current) drug therapy: Secondary | ICD-10-CM | POA: Insufficient documentation

## 2015-06-03 DIAGNOSIS — R630 Anorexia: Secondary | ICD-10-CM | POA: Diagnosis not present

## 2015-06-03 DIAGNOSIS — N8320 Unspecified ovarian cysts: Secondary | ICD-10-CM | POA: Insufficient documentation

## 2015-06-03 DIAGNOSIS — R1110 Vomiting, unspecified: Secondary | ICD-10-CM | POA: Diagnosis not present

## 2015-06-03 DIAGNOSIS — R1031 Right lower quadrant pain: Secondary | ICD-10-CM | POA: Diagnosis present

## 2015-06-03 DIAGNOSIS — Z872 Personal history of diseases of the skin and subcutaneous tissue: Secondary | ICD-10-CM | POA: Insufficient documentation

## 2015-06-03 LAB — COMPREHENSIVE METABOLIC PANEL
ALK PHOS: 49 U/L (ref 38–126)
ALT: 17 U/L (ref 14–54)
AST: 16 U/L (ref 15–41)
Albumin: 4.3 g/dL (ref 3.5–5.0)
Anion gap: 7 (ref 5–15)
BILIRUBIN TOTAL: 0.6 mg/dL (ref 0.3–1.2)
BUN: 12 mg/dL (ref 6–20)
CHLORIDE: 108 mmol/L (ref 101–111)
CO2: 23 mmol/L (ref 22–32)
Calcium: 8.7 mg/dL — ABNORMAL LOW (ref 8.9–10.3)
Creatinine, Ser: 0.48 mg/dL (ref 0.44–1.00)
GFR calc non Af Amer: >60 mL/min (ref >60)
Glucose, Bld: 95 mg/dL (ref 65–99)
POTASSIUM: 3.7 mmol/L (ref 3.5–5.1)
Sodium: 138 mmol/L (ref 135–145)
TOTAL PROTEIN: 7.9 g/dL (ref 6.5–8.1)

## 2015-06-03 LAB — LIPASE, BLOOD: Lipase: 16 U/L — ABNORMAL LOW (ref 22–51)

## 2015-06-03 LAB — CBC WITH DIFFERENTIAL/PLATELET
BASOS PCT: 0 % (ref 0–1)
Basophils Absolute: 0 10*3/uL (ref 0.0–0.1)
Eosinophils Absolute: 0 10*3/uL (ref 0.0–0.7)
Eosinophils Relative: 1 % (ref 0–5)
HCT: 39.8 % (ref 36.0–46.0)
HEMOGLOBIN: 13.1 g/dL (ref 12.0–15.0)
Lymphocytes Relative: 30 % (ref 12–46)
Lymphs Abs: 1.6 10*3/uL (ref 0.7–4.0)
MCH: 28.1 pg (ref 26.0–34.0)
MCHC: 32.9 g/dL (ref 30.0–36.0)
MCV: 85.4 fL (ref 78.0–100.0)
MONOS PCT: 6 % (ref 3–12)
Monocytes Absolute: 0.3 10*3/uL (ref 0.1–1.0)
NEUTROS ABS: 3.4 10*3/uL (ref 1.7–7.7)
NEUTROS PCT: 63 % (ref 43–77)
Platelets: 141 10*3/uL — ABNORMAL LOW (ref 150–400)
RBC: 4.66 MIL/uL (ref 3.87–5.11)
RDW: 12.8 % (ref 11.5–15.5)
WBC: 5.4 10*3/uL (ref 4.0–10.5)

## 2015-06-03 LAB — I-STAT CG4 LACTIC ACID, ED: Lactic Acid, Venous: 0.54 mmol/L (ref 0.5–2.0)

## 2015-06-03 LAB — POC URINE PREG, ED: Preg Test, Ur: NEGATIVE

## 2015-06-03 MED ORDER — SODIUM CHLORIDE 0.9 % IV BOLUS (SEPSIS)
1000.0000 mL | Freq: Once | INTRAVENOUS | Status: AC
  Administered 2015-06-03: 1000 mL via INTRAVENOUS

## 2015-06-03 MED ORDER — ONDANSETRON HCL 4 MG/2ML IJ SOLN
4.0000 mg | Freq: Once | INTRAMUSCULAR | Status: AC
  Administered 2015-06-03: 4 mg via INTRAVENOUS
  Filled 2015-06-03: qty 2

## 2015-06-03 MED ORDER — IOHEXOL 300 MG/ML  SOLN
100.0000 mL | Freq: Once | INTRAMUSCULAR | Status: AC | PRN
  Administered 2015-06-03: 100 mL via INTRAVENOUS

## 2015-06-03 MED ORDER — HYDROCODONE-ACETAMINOPHEN 5-325 MG PO TABS: 1.0000 | ORAL_TABLET | ORAL | Status: DC | PRN

## 2015-06-03 MED ORDER — MORPHINE SULFATE 4 MG/ML IJ SOLN
8.0000 mg | Freq: Once | INTRAMUSCULAR | Status: AC
  Administered 2015-06-03: 8 mg via INTRAVENOUS
  Filled 2015-06-03: qty 2

## 2015-06-03 NOTE — ED Provider Notes (Signed)
CSN: [id number]     Arrival date & time 06/03/15  1239 History   First MD Initiated Contact with Patient 06/03/15 1247     Chief Complaint  Patient presents with  . Abdominal Pain     (Consider location/radiation/quality/duration/timing/severity/associated sxs/prior Treatment) Patient is a 39 y.o. female presenting with abdominal pain. The history is provided by the patient.  Abdominal Pain Pain location:  RLQ Pain quality: aching and sharp   Pain radiates to:  Does not radiate Pain severity:  Moderate Onset quality:  Gradual Duration:  1 day Timing:  Constant Progression:  Unchanged Chronicity:  New Relieved by:  Nothing Worsened by:  Nothing tried Ineffective treatments:  None tried Associated symptoms: anorexia, diarrhea and vomiting   Associated symptoms: no fever   Risk factors: not pregnant     Past Medical History  Diagnosis Date  . Anemia   . Allergy   . Eczema   . Chronic headache    Past Surgical History  Procedure Laterality Date  . Cesarean section    . Wisdom tooth extraction     Family History  Problem Relation Age of Onset  . Hypertension Mother   . Hypertension Mother    History  Substance Use Topics  . Smoking status: Never Smoker   . Smokeless tobacco: Never Used  . Alcohol Use: 0.0 oz/week    .5 drink(s) per week     Comment: less than 1 a week   OB History    No data available     Review of Systems  Constitutional: Negative for fever.  Gastrointestinal: Positive for vomiting, abdominal pain, diarrhea and anorexia.  All other systems reviewed and are negative.     Allergies  Review of patient's allergies indicates no known allergies.  Home Medications   Prior to Admission medications   Medication Sig Start Date End Date Taking? Authorizing Provider  cetirizine (ZYRTEC) 10 MG tablet Take 10 mg by mouth daily as needed for allergies.   Yes Historical Provider, MD  Levonorgestrel (MIRENA IU) by Intrauterine route.     Yes  Historical Provider, MD  Multiple Vitamin (MULTIVITAMIN) tablet Take 1 tablet by mouth daily.     Yes Historical Provider, MD  HYDROcodone-acetaminophen (NORCO/VICODIN) 5-325 MG per tablet Take 1 tablet by mouth every 4 (four) hours as needed. 06/03/15   Lyndal Pulley, MD  mometasone (NASONEX) 50 MCG/ACT nasal spray Place 2 sprays into the nose daily. 03/13/12 03/13/13  Kermit Balo Tysinger, PA-C  omeprazole (PRILOSEC) 40 MG capsule Take 1 tablet 45 minutes prior to breakfast daily Patient not taking: Reported on 06/03/2015 01/06/13   Kermit Balo Tysinger, PA-C   BP 119/72 mmHg  Pulse 64  Temp(Src) 98.5 F (36.9 C) (Oral)  Resp 18  SpO2 98% Physical Exam  Constitutional: She is oriented to person, place, and time. She appears well-developed and well-nourished. No distress.  HENT:  Head: Normocephalic.  Eyes: Conjunctivae are normal.  Neck: Neck supple. No tracheal deviation present.  Cardiovascular: Normal rate and regular rhythm.   Pulmonary/Chest: Effort normal. No respiratory distress.  Abdominal: Soft. She exhibits no distension. There is tenderness (RLQ). There is rebound and guarding (RLQ voluntary).  No signs of peritonitis  Neurological: She is alert and oriented to person, place, and time.  Skin: Skin is warm and dry.  Psychiatric: She has a normal mood and affect.    ED Course  Procedures (including critical care time) Labs Review Labs Reviewed  CBC WITH DIFFERENTIAL/PLATELET - Abnormal; Notable for  the following:    Platelets 141 (*)    All other components within normal limits  COMPREHENSIVE METABOLIC PANEL - Abnormal; Notable for the following:    Calcium 8.7 (*)    All other components within normal limits  LIPASE, BLOOD - Abnormal; Notable for the following:    Lipase 16 (*)    All other components within normal limits  POC URINE PREG, ED  I-STAT CG4 LACTIC ACID, ED    Imaging Review US Transvaginal Non-ob  06/03/2015   CLINICAL DATA:  Right pelvic pain since this  morning. IUD placed 4 years ago.  EXAM: TRANSABDOMINAL AND TRANSVAGINAL ULTRASOUND OF PELVIS  DOPPLER ULTRASOUND OF OVARIES  TECHNIQUE: Both transabdominal and transvaginal ultrasound examinations of the pelvis were performed. Transabdominal technique was performed for global imaging of the pelvis including uterus, ovaries, adnexal regions, and pelvic cul-de-sac.  It was necessary to proceed with endovaginal exam following the transabdominal exam to visualize the endometrium and ovaries. Color and duplex Doppler ultrasound was utilized to evaluate blood flow to the ovaries.  COMPARISON:  CT 06/03/2015  FINDINGS: Uterus  Measurements: 5.4 x 8.2 x 12.4 cm. 1.8 cm oval hypoechoic mass over the anterior uterine fundus likely an intramural fibroid. Nabothian cyst over the cervix.  Endometrium  Thickness: 8 mm. IUD in adequate position over the fundal/body endometrium. No focal abnormality visualized.  Right ovary  Measurements: 6.4 x 8.9 x 9.2 cm. Predominately simple cystic structure with minimal dependent internal debris measuring 5.8 x 6.5 x 7.9 cm. No thick septations or significant solid component and no internal vascularity.  Left ovary  Measurements: 2.0 x 2.3 x 2.5 cm. Normal appearance/no adnexal mass.  Pulsed Doppler evaluation of both ovaries demonstrates normal low-resistance arterial and venous waveforms.  Other findings  No free fluid.  IMPRESSION: Predominately simple right ovarian cyst measuring 5.8 x 6.5 x 7.9 cm with very minimal dependent internal debris. No significant solid component or internal vascularity. Likely a follicular cyst. Recommend followup ultrasound 6 weeks document stability/resolution. This recommendation follows the consensus statement: Management of Asymptomatic Ovarian and Other Adnexal Cysts Imaged at [place]: Society of Radiologists in Ultrasound Consensus Conference Statement. Radiology 2010; [phone].  Mildly enlarged uterus with 1.8 cm anterior body/ fundal fibroid. IUD in  adequate position. Nabothian cyst.   Electronically Signed   By: Elberta Fortis M.D.   On: 06/03/2015 16:24   US Pelvis Complete  06/03/2015   CLINICAL DATA:  Right pelvic pain since this morning. IUD placed 4 years ago.  EXAM: TRANSABDOMINAL AND TRANSVAGINAL ULTRASOUND OF PELVIS  DOPPLER ULTRASOUND OF OVARIES  TECHNIQUE: Both transabdominal and transvaginal ultrasound examinations of the pelvis were performed. Transabdominal technique was performed for global imaging of the pelvis including uterus, ovaries, adnexal regions, and pelvic cul-de-sac.  It was necessary to proceed with endovaginal exam following the transabdominal exam to visualize the endometrium and ovaries. Color and duplex Doppler ultrasound was utilized to evaluate blood flow to the ovaries.  COMPARISON:  CT 06/03/2015  FINDINGS: Uterus  Measurements: 5.4 x 8.2 x 12.4 cm. 1.8 cm oval hypoechoic mass over the anterior uterine fundus likely an intramural fibroid. Nabothian cyst over the cervix.  Endometrium  Thickness: 8 mm. IUD in adequate position over the fundal/body endometrium. No focal abnormality visualized.  Right ovary  Measurements: 6.4 x 8.9 x 9.2 cm. Predominately simple cystic structure with minimal dependent internal debris measuring 5.8 x 6.5 x 7.9 cm. No thick septations or significant solid component and no internal vascularity.  Left ovary  Measurements: 2.0 x 2.3 x 2.5 cm. Normal appearance/no adnexal mass.  Pulsed Doppler evaluation of both ovaries demonstrates normal low-resistance arterial and venous waveforms.  Other findings  No free fluid.  IMPRESSION: Predominately simple right ovarian cyst measuring 5.8 x 6.5 x 7.9 cm with very minimal dependent internal debris. No significant solid component or internal vascularity. Likely a follicular cyst. Recommend followup ultrasound 6 weeks document stability/resolution. This recommendation follows the consensus statement: Management of Asymptomatic Ovarian and Other Adnexal Cysts  Imaged at [place]: Society of Radiologists in Ultrasound Consensus Conference Statement. Radiology 2010; [phone].  Mildly enlarged uterus with 1.8 cm anterior body/ fundal fibroid. IUD in adequate position. Nabothian cyst.   Electronically Signed   By: Elberta Fortis M.D.   On: 06/03/2015 16:24   Ct Abdomen Pelvis W Contrast  06/03/2015   CLINICAL DATA:  RIGHT lower quadrant abdominal pain. Two episodes of vomiting. One of the diarrhea.  EXAM: CT ABDOMEN AND PELVIS WITH CONTRAST  TECHNIQUE: Multidetector CT imaging of the abdomen and pelvis was performed using the standard protocol following bolus administration of intravenous contrast.  CONTRAST:  OMNIPAQUE IOHEXOL 300 MG/ML  SOLN  COMPARISON:  None.  FINDINGS: Lower chest: Lung bases are clear.  Hepatobiliary: No focal hepatic lesion. No biliary duct dilatation. Gallbladder is normal. Common bile duct is normal.  Pancreas: Pancreas is normal. No ductal dilatation. No pancreatic inflammation.  Spleen: Normal spleen  Adrenals/urinary tract: Adrenal glands and kidneys are normal. The ureters and bladder normal.  Stomach/Bowel: Stomach and small bowel are normal. The cecum is midline in the suprapubic region. Normal appendix is identified on images 67 through 72 of sagittal series 4. The more distal colon is normal. Rectum is normal.  Vascular/Lymphatic: Abdominal aorta is normal caliber. There is no retroperitoneal or periportal lymphadenopathy. No pelvic lymphadenopathy.  Reproductive: The uterus is normal. IUD in expected location within the endometrial canal. The LEFT ovary is not well identified. The RIGHT ovary has an associated large round simple fluid attenuation cystic mass. This large round cystic mass measures 8.1 by 5.4 x 7.9 cm.  Musculoskeletal: No aggressive osseous lesion.  Other: No free fluid.  IMPRESSION: 1. Large cystic mass associated with the RIGHT ovary measures 8.2 cm in maximum diameter. Recommend non emergent GYN surgical consultation.  2. Cecum is midline.  Normal appendix identified. 3. IUD in expected location.   Electronically Signed   By: Genevive Bi M.D.   On: 06/03/2015 14:47   [place] Art/ven Flow Abd Pelv Doppler  06/03/2015   CLINICAL DATA:  Right pelvic pain since this morning. IUD placed 4 years ago.  EXAM: TRANSABDOMINAL AND TRANSVAGINAL ULTRASOUND OF PELVIS  DOPPLER ULTRASOUND OF OVARIES  TECHNIQUE: Both transabdominal and transvaginal ultrasound examinations of the pelvis were performed. Transabdominal technique was performed for global imaging of the pelvis including uterus, ovaries, adnexal regions, and pelvic cul-de-sac.  It was necessary to proceed with endovaginal exam following the transabdominal exam to visualize the endometrium and ovaries. Color and duplex Doppler ultrasound was utilized to evaluate blood flow to the ovaries.  COMPARISON:  CT 06/03/2015  FINDINGS: Uterus  Measurements: 5.4 x 8.2 x 12.4 cm. 1.8 cm oval hypoechoic mass over the anterior uterine fundus likely an intramural fibroid. Nabothian cyst over the cervix.  Endometrium  Thickness: 8 mm. IUD in adequate position over the fundal/body endometrium. No focal abnormality visualized.  Right ovary  Measurements: 6.4 x 8.9 x 9.2 cm. Predominately simple cystic structure with minimal dependent  internal debris measuring 5.8 x 6.5 x 7.9 cm. No thick septations or significant solid component and no internal vascularity.  Left ovary  Measurements: 2.0 x 2.3 x 2.5 cm. Normal appearance/no adnexal mass.  Pulsed Doppler evaluation of both ovaries demonstrates normal low-resistance arterial and venous waveforms.  Other findings  No free fluid.  IMPRESSION: Predominately simple right ovarian cyst measuring 5.8 x 6.5 x 7.9 cm with very minimal dependent internal debris. No significant solid component or internal vascularity. Likely a follicular cyst. Recommend followup ultrasound 6 weeks document stability/resolution. This recommendation follows the consensus statement:  Management of Asymptomatic Ovarian and Other Adnexal Cysts Imaged at [place]: Society of Radiologists in Ultrasound Consensus Conference Statement. Radiology 2010; [phone].  Mildly enlarged uterus with 1.8 cm anterior body/ fundal fibroid. IUD in adequate position. Nabothian cyst.   Electronically Signed   By: Elberta Fortis M.D.   On: 06/03/2015 16:24   I independently viewed and interpreted the above radiology studies and agree with radiologist report.   EKG Interpretation None      MDM   Final diagnoses:  Right ovarian cyst    39 year old female woke up this morning with periumbilical pain that migrated into the right lower quadrant. She has rebound tenderness in clinically I have a high suspicion for acute appendicitis. Screening labs and CT scan ordered, patient made NPO and consult to surgery as indicated by results. Considered ovarian cyst, ovarian torsion highly unlikely due to constant nature of pain.  CT consistent with right ovarian cyst, large with increased risk of torsion although clinical appearance is inconsistent. Discussed with o/c OB MD who agreed with [place] to assess flow and will see the patient in follow up if no evidence of torsion currently.   Lyndal Pulley, MD 06/03/15 [phone]

## 2015-06-03 NOTE — ED Notes (Signed)
Pt aware that urine sample is needed, unable to void at this time.

## 2015-06-03 NOTE — ED Notes (Signed)
She c/o rlq area abd. Pain plus two episodes of emesis and one episode of diarrhea--started this morning.

## 2015-06-03 NOTE — Discharge Instructions (Signed)
Ovarian Cyst An ovarian cyst is a fluid-filled sac that forms on an ovary. The ovaries are small organs that produce eggs in women. Various types of cysts can form on the ovaries. Most are not cancerous. Many do not cause problems, and they often go away on their own. Some may cause symptoms and require treatment. Common types of ovarian cysts include:  Functional cysts--These cysts may occur every month during the menstrual cycle. This is normal. The cysts usually go away with the next menstrual cycle if the woman does not get pregnant. Usually, there are no symptoms with a functional cyst.  Endometrioma cysts--These cysts form from the tissue that lines the uterus. They are also called "chocolate cysts" because they become filled with blood that turns brown. This type of cyst can cause pain in the lower abdomen during intercourse and with your menstrual period.  Cystadenoma cysts--This type develops from the cells on the outside of the ovary. These cysts can get very big and cause lower abdomen pain and pain with intercourse. This type of cyst can twist on itself, cut off its blood supply, and cause severe pain. It can also easily rupture and cause a lot of pain.  Dermoid cysts--This type of cyst is sometimes found in both ovaries. These cysts may contain different kinds of body tissue, such as skin, teeth, hair, or cartilage. They usually do not cause symptoms unless they get very big.  Theca lutein cysts--These cysts occur when too much of a certain hormone (human chorionic gonadotropin) is produced and overstimulates the ovaries to produce an egg. This is most common after procedures used to assist with the conception of a baby (in vitro fertilization). CAUSES   Fertility drugs can cause a condition in which multiple large cysts are formed on the ovaries. This is called ovarian hyperstimulation syndrome.  A condition called polycystic ovary syndrome can cause hormonal imbalances that can lead to  nonfunctional ovarian cysts. SIGNS AND SYMPTOMS  Many ovarian cysts do not cause symptoms. If symptoms are present, they may include:  Pelvic pain or pressure.  Pain in the lower abdomen.  Pain during sexual intercourse.  Increasing girth (swelling) of the abdomen.  Abnormal menstrual periods.  Increasing pain with menstrual periods.  Stopping having menstrual periods without being pregnant. DIAGNOSIS  These cysts are commonly found during a routine or annual pelvic exam. Tests may be ordered to find out more about the cyst. These tests may include:  Ultrasound.  X-ray of the pelvis.  CT scan.  MRI.  Blood tests. TREATMENT  Many ovarian cysts go away on their own without treatment. Your health care provider may want to check your cyst regularly for 2-3 months to see if it changes. For women in menopause, it is particularly important to monitor a cyst closely because of the higher rate of ovarian cancer in menopausal women. When treatment is needed, it may include any of the following:  A procedure to drain the cyst (aspiration). This may be done using a long needle and ultrasound. It can also be done through a laparoscopic procedure. This involves using a thin, lighted tube with a tiny camera on the end (laparoscope) inserted through a small incision.  Surgery to remove the whole cyst. This may be done using laparoscopic surgery or an open surgery involving a larger incision in the lower abdomen.  Hormone treatment or birth control pills. These methods are sometimes used to help dissolve a cyst. HOME CARE INSTRUCTIONS   Only take over-the-counter   or prescription medicines as directed by your health care provider.  Follow up with your health care provider as directed.  Get regular pelvic exams and Pap tests. SEEK MEDICAL CARE IF:   Your periods are late, irregular, or painful, or they stop.  Your pelvic pain or abdominal pain does not go away.  Your abdomen becomes  larger or swollen.  You have pressure on your bladder or trouble emptying your bladder completely.  You have pain during sexual intercourse.  You have feelings of fullness, pressure, or discomfort in your stomach.  You lose weight for no apparent reason.  You feel generally ill.  You become constipated.  You lose your appetite.  You develop acne.  You have an increase in body and facial hair.  You are gaining weight, without changing your exercise and eating habits.  You think you are pregnant. SEEK IMMEDIATE MEDICAL CARE IF:   You have increasing abdominal pain.  You feel sick to your stomach (nauseous), and you throw up (vomit).  You develop a fever that comes on suddenly.  You have abdominal pain during a bowel movement.  Your menstrual periods become heavier than usual. MAKE SURE YOU:  Understand these instructions.  Will watch your condition.  Will get help right away if you are not doing well or get worse. Document Released: 10/21/2005 Document Revised: 10/26/2013 Document Reviewed: 06/28/2013 Exit[employer] Patient Information 2015 [employer]. This information is not intended to replace advice given to you by your health care provider. Make sure you discuss any questions you have with your health care provider.  

## 2015-06-03 NOTE — ED Provider Notes (Signed)
Pt needs [facility] with [facility]/s.  They will call her on Monday.  D/ced home.  Gwyneth Sprout, MD 06/03/15 [phone]

## 2015-07-20 ENCOUNTER — Other Ambulatory Visit (HOSPITAL_COMMUNITY): Payer: Self-pay | Admitting: Obstetrics and Gynecology

## 2015-07-20 NOTE — H&P (Signed)
Carmen Simmons is a 39 y.o.  P: 4-0-2-4 present for tubal sterilization because of her desire for permanent sterilization.  The patient has five children to care for and a Mirena IUD that will expire in January 2017.  In the past she has used oral contraceptives, Nuvaring,  Depo Provera and condoms for contraception but she and her husband do not desire more children. The patient consents for bilateral tubal sterilization.  Past Medical History  OB History: G: 6;   P: 4-0-3-4;   SVB: 1999 and 2003 and C-section: 2011 (twins)  GYN History: menarche: 39 YO     LMP: none due to Mirena IUD     Had abnormal PAP smears (2001)  treated with Cryotherapy and LEEP but have been normal since;  Last PAP smear: 2013-normal  Medical History: SVT, Anemia, Chronic Sinusitis, Eczema and Umbilical Hernia  Surgical History:   2001  Cervical Cryotherapy and Loop Excision Electrosurgical Procedure;      2011 C-section For Twins Denies problems with anesthesia or history of blood transfusions  Family History: Hypertension  Social History:  Married and works in Sales;  Denies tobacco or alcohol intake   Medication  Mirena IUD expires January 2017 Multivitamins daily  No Known Allergies   Denies sensitivity to peanuts, shellfish, soy, latex or adhesives.   ROS: Admits to glasses, chronic sinusitis with accompanying headache but  denies  vision changes,  dysphagia, tinnitus, dizziness, hoarseness, cough,  chest pain, shortness of breath, nausea, vomiting, diarrhea,constipation,  urinary frequency, urgency  dysuria, hematuria, vaginitis symptoms, pelvic pain, swelling of joints,easy bruising,  myalgias, arthralgias, skin rashes, unexplained weight loss and except as is mentioned in the history of present illness, patient's review of systems is otherwise negative.    Physical Exam Bp: 114/70   Weight: 218 lbs    Height: 5'10"   BMI: 31.3   Neck: supple without masses or thyromegaly Lungs: clear to  auscultation Heart: regular rate and rhythm Abdomen: soft, non-tender and no organomegaly Pelvic:EGBUS- wnl; vagina-normal rugae; uterus-normal size, cervix:  without lesions or motion tenderness;IUD strings visible ; adnexae-no tenderness or masses Extremities:  no clubbing, cyanosis or edema   Assesment: Desire for Sterilization   Disposition:  A discussion was held with patient regarding the indication for her procedure(s) along with the risks, which include but are not limited to: reaction to anesthesia, damage to adjacent organs, infection and excessive bleeding.  The patient verbalized understanding of these risks and has consented to proceed with Bilateral Salpingectomy with Removal of IUD at [facility]   CSN# [id number]   Carmen Cheuvront J. Kathaleya Mcduffee, PA-C  for Dr. Naima A. Dillard  

## 2015-07-26 ENCOUNTER — Ambulatory Visit: Payer: Commercial Managed Care - PPO | Admitting: Obstetrics & Gynecology

## 2015-08-01 ENCOUNTER — Encounter (HOSPITAL_COMMUNITY): Payer: Self-pay | Admitting: *Deleted

## 2015-08-04 ENCOUNTER — Other Ambulatory Visit: Payer: Self-pay | Admitting: Obstetrics and Gynecology

## 2015-08-09 ENCOUNTER — Ambulatory Visit (HOSPITAL_COMMUNITY): Payer: Commercial Managed Care - PPO | Admitting: Anesthesiology

## 2015-08-09 ENCOUNTER — Encounter (HOSPITAL_COMMUNITY): Payer: Self-pay | Admitting: *Deleted

## 2015-08-09 ENCOUNTER — Ambulatory Visit (HOSPITAL_COMMUNITY)
Admission: RE | Admit: 2015-08-09 | Discharge: 2015-08-09 | Disposition: A | Discharge status: Home / Self Care | Payer: Commercial Managed Care - PPO | Source: Ambulatory Visit | Attending: Obstetrics and Gynecology | Admitting: Obstetrics and Gynecology

## 2015-08-09 ENCOUNTER — Encounter (HOSPITAL_COMMUNITY)
Admission: RE | Disposition: A | Discharge status: Home / Self Care | Payer: Self-pay | Source: Ambulatory Visit | Attending: Obstetrics and Gynecology

## 2015-08-09 DIAGNOSIS — Z302 Encounter for sterilization: Secondary | ICD-10-CM | POA: Diagnosis not present

## 2015-08-09 HISTORY — PX: IUD REMOVAL: SHX5392

## 2015-08-09 HISTORY — PX: LAPAROSCOPIC BILATERAL SALPINGECTOMY: SHX5889

## 2015-08-09 LAB — CBC
HEMATOCRIT: 40.4 % (ref 36.0–46.0)
HEMOGLOBIN: 13.3 g/dL (ref 12.0–15.0)
MCH: 28.7 pg (ref 26.0–34.0)
MCHC: 32.9 g/dL (ref 30.0–36.0)
MCV: 87.1 fL (ref 78.0–100.0)
Platelets: 158 10*3/uL (ref 150–400)
RBC: 4.64 MIL/uL (ref 3.87–5.11)
RDW: 13.2 % (ref 11.5–15.5)
WBC: 6.1 10*3/uL (ref 4.0–10.5)

## 2015-08-09 LAB — PREGNANCY, URINE: PREG TEST UR: NEGATIVE

## 2015-08-09 SURGERY — SALPINGECTOMY, BILATERAL, LAPAROSCOPIC
Anesthesia: General | Site: Vagina

## 2015-08-09 MED ORDER — LIDOCAINE HCL (CARDIAC) 20 MG/ML IV SOLN
INTRAVENOUS | Status: DC | PRN
  Administered 2015-08-09: 80 mg via INTRAVENOUS

## 2015-08-09 MED ORDER — ONDANSETRON HCL 4 MG/2ML IJ SOLN
INTRAMUSCULAR | Status: DC | PRN
  Administered 2015-08-09: 4 mg via INTRAVENOUS

## 2015-08-09 MED ORDER — HYDROCODONE-ACETAMINOPHEN 5-325 MG PO TABS: ORAL_TABLET | ORAL | Status: DC

## 2015-08-09 MED ORDER — FENTANYL CITRATE (PF) 100 MCG/2ML IJ SOLN: 25.0000 ug | INTRAMUSCULAR | Status: DC | PRN

## 2015-08-09 MED ORDER — NEOSTIGMINE METHYLSULFATE 10 MG/10ML IV SOLN
INTRAVENOUS | Status: AC
  Filled 2015-08-09: qty 1

## 2015-08-09 MED ORDER — SCOPOLAMINE 1 MG/3DAYS TD PT72
1.0000 | MEDICATED_PATCH | Freq: Once | TRANSDERMAL | Status: DC
  Administered 2015-08-09: 1.5 mg via TRANSDERMAL

## 2015-08-09 MED ORDER — FENTANYL CITRATE (PF) 100 MCG/2ML IJ SOLN
INTRAMUSCULAR | Status: DC | PRN
  Administered 2015-08-09: 150 ug via INTRAVENOUS
  Administered 2015-08-09 (×2): 50 ug via INTRAVENOUS

## 2015-08-09 MED ORDER — PROPOFOL 10 MG/ML IV BOLUS
INTRAVENOUS | Status: AC
  Filled 2015-08-09: qty 20

## 2015-08-09 MED ORDER — MIDAZOLAM HCL 2 MG/2ML IJ SOLN
INTRAMUSCULAR | Status: AC
  Filled 2015-08-09: qty 4

## 2015-08-09 MED ORDER — KETOROLAC TROMETHAMINE 30 MG/ML IJ SOLN
INTRAMUSCULAR | Status: DC | PRN
  Administered 2015-08-09: 30 mg via INTRAVENOUS

## 2015-08-09 MED ORDER — BUPIVACAINE-EPINEPHRINE (PF) 0.25% -1:200000 IJ SOLN
INTRAMUSCULAR | Status: AC
  Filled 2015-08-09: qty 30

## 2015-08-09 MED ORDER — ONDANSETRON HCL 4 MG/2ML IJ SOLN
INTRAMUSCULAR | Status: AC
  Filled 2015-08-09: qty 2

## 2015-08-09 MED ORDER — METOCLOPRAMIDE HCL 5 MG/ML IJ SOLN
INTRAMUSCULAR | Status: AC
  Administered 2015-08-09: 10 mg via INTRAVENOUS
  Filled 2015-08-09: qty 2

## 2015-08-09 MED ORDER — FENTANYL CITRATE (PF) 100 MCG/2ML IJ SOLN
25.0000 ug | INTRAMUSCULAR | Status: DC | PRN
Start: 2015-08-09 — End: 2015-08-09

## 2015-08-09 MED ORDER — GLYCOPYRROLATE 0.2 MG/ML IJ SOLN
INTRAMUSCULAR | Status: AC
  Filled 2015-08-09: qty 4

## 2015-08-09 MED ORDER — IBUPROFEN 600 MG PO TABS: ORAL_TABLET | ORAL | Status: DC

## 2015-08-09 MED ORDER — DEXAMETHASONE SODIUM PHOSPHATE 10 MG/ML IJ SOLN
INTRAMUSCULAR | Status: AC
  Filled 2015-08-09: qty 1

## 2015-08-09 MED ORDER — SILVER NITRATE-POT NITRATE 75-25 % EX MISC
CUTANEOUS | Status: DC | PRN
  Administered 2015-08-09: 2

## 2015-08-09 MED ORDER — LACTATED RINGERS IR SOLN
Status: DC | PRN
  Administered 2015-08-09: 3000 mL

## 2015-08-09 MED ORDER — KETOROLAC TROMETHAMINE 30 MG/ML IJ SOLN
INTRAMUSCULAR | Status: AC
  Filled 2015-08-09: qty 1

## 2015-08-09 MED ORDER — GLYCOPYRROLATE 0.2 MG/ML IJ SOLN
INTRAMUSCULAR | Status: DC | PRN
  Administered 2015-08-09: 0.6 mg via INTRAVENOUS

## 2015-08-09 MED ORDER — LIDOCAINE HCL (CARDIAC) 20 MG/ML IV SOLN
INTRAVENOUS | Status: AC
  Filled 2015-08-09: qty 5

## 2015-08-09 MED ORDER — ROCURONIUM BROMIDE 100 MG/10ML IV SOLN
INTRAVENOUS | Status: DC | PRN
  Administered 2015-08-09: 50 mg via INTRAVENOUS

## 2015-08-09 MED ORDER — DOXYCYCLINE HYCLATE 100 MG PO TABS: 100.0000 mg | ORAL_TABLET | Freq: Two times a day (BID) | ORAL | Status: DC

## 2015-08-09 MED ORDER — ONDANSETRON HCL 4 MG/2ML IJ SOLN: 4.0000 mg | Freq: Once | INTRAMUSCULAR | Status: DC | PRN

## 2015-08-09 MED ORDER — DEXAMETHASONE SODIUM PHOSPHATE 10 MG/ML IJ SOLN
INTRAMUSCULAR | Status: DC | PRN
  Administered 2015-08-09: 10 mg via INTRAVENOUS

## 2015-08-09 MED ORDER — LACTATED RINGERS IV SOLN
INTRAVENOUS | Status: DC
  Administered 2015-08-09 (×2): via INTRAVENOUS

## 2015-08-09 MED ORDER — METOCLOPRAMIDE HCL 5 MG/ML IJ SOLN
10.0000 mg | Freq: Once | INTRAMUSCULAR | Status: AC
  Administered 2015-08-09: 10 mg via INTRAVENOUS

## 2015-08-09 MED ORDER — SCOPOLAMINE 1 MG/3DAYS TD PT72
MEDICATED_PATCH | TRANSDERMAL | Status: AC
  Administered 2015-08-09: 1.5 mg via TRANSDERMAL
  Filled 2015-08-09: qty 1

## 2015-08-09 MED ORDER — FENTANYL CITRATE (PF) 250 MCG/5ML IJ SOLN
INTRAMUSCULAR | Status: AC
  Filled 2015-08-09: qty 25

## 2015-08-09 MED ORDER — NEOSTIGMINE METHYLSULFATE 10 MG/10ML IV SOLN
INTRAVENOUS | Status: DC | PRN
  Administered 2015-08-09: 3 mg via INTRAVENOUS

## 2015-08-09 MED ORDER — PROPOFOL 10 MG/ML IV BOLUS
INTRAVENOUS | Status: DC | PRN
  Administered 2015-08-09: 150 mg via INTRAVENOUS
  Administered 2015-08-09: 50 mg via INTRAVENOUS
  Administered 2015-08-09: 100 mg via INTRAVENOUS

## 2015-08-09 MED ORDER — BUPIVACAINE-EPINEPHRINE 0.25% -1:200000 IJ SOLN
INTRAMUSCULAR | Status: DC | PRN
  Administered 2015-08-09: 30 mL

## 2015-08-09 MED ORDER — MIDAZOLAM HCL 5 MG/5ML IJ SOLN
INTRAMUSCULAR | Status: DC | PRN
  Administered 2015-08-09: 2 mg via INTRAVENOUS

## 2015-08-09 SURGICAL SUPPLY — 25 items
CHLORAPREP W/TINT 26ML (MISCELLANEOUS) ×4 IMPLANT
CLOTH BEACON ORANGE TIMEOUT ST (SAFETY) ×4 IMPLANT
DRSG COVADERM PLUS 2X2 (GAUZE/BANDAGES/DRESSINGS) ×8 IMPLANT
DRSG OPSITE POSTOP 3X4 (GAUZE/BANDAGES/DRESSINGS) ×3 IMPLANT
FORCEPS CUTTING 33CM 5MM (CUTTING FORCEPS) ×3 IMPLANT
GLOVE BIO SURGEON STRL SZ 6.5 (GLOVE) ×3 IMPLANT
GLOVE BIO SURGEONS STRL SZ 6.5 (GLOVE) ×1
GLOVE BIOGEL PI IND STRL 7.0 (GLOVE) ×4 IMPLANT
GLOVE BIOGEL PI INDICATOR 7.0 (GLOVE) ×4
GOWN STRL REUS W/TWL LRG LVL3 (GOWN DISPOSABLE) ×8 IMPLANT
LIQUID BAND (GAUZE/BANDAGES/DRESSINGS) ×4 IMPLANT
NS IRRIG 1000ML POUR BTL (IV SOLUTION) ×4 IMPLANT
PACK LAPAROSCOPY BASIN (CUSTOM PROCEDURE TRAY) ×4 IMPLANT
PAD POSITIONING PINK XL (MISCELLANEOUS) ×4 IMPLANT
SET IRRIG TUBING LAPAROSCOPIC (IRRIGATION / IRRIGATOR) ×3 IMPLANT
SLEEVE XCEL OPT CAN 5 100 (ENDOMECHANICALS) ×4 IMPLANT
SOLUTION ELECTROLUBE (MISCELLANEOUS) IMPLANT
SUT MNCRL AB 3-0 PS2 27 (SUTURE) ×4 IMPLANT
SUT VICRYL 0 UR6 27IN ABS (SUTURE) ×8 IMPLANT
TOWEL OR 17X24 6PK STRL BLUE (TOWEL DISPOSABLE) ×8 IMPLANT
TRAY FOLEY CATH SILVER 14FR (SET/KITS/TRAYS/PACK) ×4 IMPLANT
TROCAR BALLN 12MMX100 BLUNT (TROCAR) ×4 IMPLANT
TROCAR XCEL NON-BLD 5MMX100MML (ENDOMECHANICALS) ×4 IMPLANT
WARMER LAPAROSCOPE (MISCELLANEOUS) ×4 IMPLANT
WATER STERILE IRR 1000ML POUR (IV SOLUTION) ×1 IMPLANT

## 2015-08-09 NOTE — Anesthesia Postprocedure Evaluation (Signed)
Anesthesia Post Note  Patient: Carmen Simmons  Procedure(s) Performed: Procedure(s) (LRB): LAPAROSCOPIC BILATERAL SALPINGECTOMY (Bilateral) INTRAUTERINE DEVICE (IUD) REMOVAL (N/A)  Anesthesia type: General  Patient location: PACU  Post pain: Pain level controlled  Post assessment: Post-op Vital signs reviewed  Last Vitals:  Filed Vitals:   08/09/15 1200  BP: 110/63  Pulse: 74  Temp:   Resp: 23    Post vital signs: Reviewed  Level of consciousness: sedated  Complications: No apparent anesthesia complications

## 2015-08-09 NOTE — Discharge Instructions (Signed)
Call [facility] @ [phone] if:  You have a temperature greater than or equal to 100.4 degrees Farenheit orally You have pain that is not made better by the pain medication given and taken as directed You have excessive bleeding or problems urinating.  (Since your IUD was removed you will have some period-like bleeding but call if you are soaking a pad an hour.)  Take Colace (Docusate Sodium/Stool Softener) 100 mg 2-3 times daily while taking narcotic pain medicine to avoid constipation or until bowel movements are regular. Take Ibuprofen 600 mg with food every 6 hours for the next 3 days then as needed for pain  You may drive after 2 days You may walk up steps  You may shower tomorrow You may resume a regular diet  Keep incisions clean and dry Do not lift over 15 pounds for 2 weeks Avoid anything in vagina  until after your post-operative visit  Keep follow up appointment with Dr. Normand Sloop on August 23, 2015 at 4 p.m. Laparoscopic Tubal Ligation, Care After Refer to this sheet in the next few weeks. These instructions provide you with information about caring for yourself after your procedure. Your health care provider may also give you more specific instructions. Your treatment has been planned according to current medical practices, but problems sometimes occur. Call your health care provider if you have any problems or questions after your procedure. WHAT TO EXPECT AFTER THE PROCEDURE After your procedure, it is common to have:  Sore throat.  Soreness at the incision site.  Mild cramping.  Tiredness.  Mild nausea or vomiting.  Shoulder pain. HOME CARE INSTRUCTIONS  Rest for the remainder of the day.  Take medicines only as directed by your health care provider. These include over-the-counter medicines and prescription medicines. Do not take aspirin, which can cause bleeding.  Over the next few days, gradually return to your normal activities and your  normal diet.  Avoid sexual intercourse for 2 weeks or as directed by your health care provider.  Do not use tampons, and do not douche.  Do not drive or operate heavy machinery while taking pain medicine.  Do not lift anything that is heavier than 5 lb (2.3 kg) for 2 weeks or as directed by your health care provider.  Do not take baths. Take showers only. Ask your health care provider when you can start taking baths.  Take your temperature twice each day and write it down.  Try to have help for your household needs for the first 7-10 days.  There are many different ways to close and cover an incision, including stitches (sutures), skin glue, and adhesive strips. Follow instructions from your health care provider about:  Incision care.  Bandage (dressing) changes and removal.  Incision closure removal.  Check your incision area every day for signs of infection. Watch for:  Redness, swelling, or pain.  Fluid, blood, or pus.  Keep all follow-up visits as directed by your health care provider. SEEK MEDICAL CARE IF:  You have redness, swelling, or increasing pain in your incision area.  You have fluid, blood, or pus coming from your incision for longer than 1 day.  You notice a bad smell coming from your incision or your dressing.  The edges of your incision break open after the sutures have been removed.  Your pain does not decrease after 2-3 days.  You have a rash.  You repeatedly become dizzy or light-headed.  You have a reaction to your medicine.  Your pain medicine is not helping.  You are constipated. SEEK IMMEDIATE MEDICAL CARE IF:  You have a fever.  You faint.  You have increasing pain in your abdomen.  You have severe pain in one or both of your shoulders.  You have bleeding or drainage from your suture sites or your vagina after surgery.  You have shortness of breath or have difficulty breathing.  You have chest pain or leg pain.  You have  ongoing nausea, vomiting, or diarrhea.   This information is not intended to replace advice given to you by your health care provider. Make sure you discuss any questions you have with your health care provider.   Document Released: 05/10/2005 Document Revised: 03/07/2015 Document Reviewed: 02/01/2012 Elsevier Interactive Patient Education [employer]

## 2015-08-09 NOTE — Anesthesia Procedure Notes (Signed)
Procedure Name: Intubation Date/Time: 08/09/2015 9:54 AM Performed by: Renford Dills Pre-anesthesia Checklist: Patient identified, Emergency Drugs available, Suction available and Patient being monitored Patient Re-evaluated:Patient Re-evaluated prior to inductionOxygen Delivery Method: Circle system utilized Preoxygenation: Pre-oxygenation with 100% oxygen Intubation Type: IV induction Ventilation: Mask ventilation without difficulty Laryngoscope Size: Miller and 2 Grade View: Grade II Tube type: Oral Tube size: 7.0 mm Number of attempts: 1 Airway Equipment and Method: Stylet Placement Confirmation: ETT inserted through vocal cords under direct vision,  positive ETCO2,  CO2 detector and breath sounds checked- equal and bilateral Secured at: 20 cm Tube secured with: Tape Dental Injury: Teeth and Oropharynx as per pre-operative assessment

## 2015-08-09 NOTE — Op Note (Signed)
dications: Ms. Ritter is a 39 y.o. female with diagnosis of multiparity.  Pre-operative Diagnosis: Multiparity desires sterilization  Post-operative Diagnosis: same  Surgeon: ZOXWRUE,AVWUJ A   Assistants: Henreitta Leber PA  Anesthesia: General endotracheal anesthesia   Procedure : Laparoscopic Bilateral Salpingectomy, Removal of mirena IUD  Procedure Details  The patient was seen in the Holding Room. The risks, benefits, complications, treatment options, and expected outcomes were discussed with the patient. The possibilities of reaction to medication, pulmonary aspiration, perforation of viscus, bleeding, recurrent infection, the need for additional procedures, failure to diagnose a condition, and creating a complication requiring transfusion or operation were discussed with the patient. The patient concurred with the proposed plan, giving informed consent. The patient was taken to the Operating Room, identified as Carmen Simmons and the procedure verified as Diagnostic Laparoscopy with B sallpingecotmy. A Time Out was held and the above information confirmed.  After induction of general anesthesia, the patient was placed in modified dorsal lithotomy position where she was prepped, draped, and catheterized in the normal, sterile fashion. A foley catheter was placed..  The cervix was visualized and an intrauterine manipulator was placed. A 2 cm umbilical incision was then performed.and carried down to the fascia.  The fascia was then opened and extended bilaterally.  Peritoneum was then entered.  o vicryl was then placed around the fascia in a circumferential fashion.   The hasson was placed and ancored to the suture.. Normal pelvic anatomy was noted.     The tubes, ovaries and abdominal anatomy apperared normal.   The anterior and  Posterior culdesac and liver appeared normal.     Two 5mm trocars were placed in the right and left lower quadrants of the abdomen under direct visualization of the  laparoscope.  The tubes were removed and sent to pathology using the gyrus.  Hemostasis was noted.  Air was allowed to leave the abdomen.  The abdomen was reinsufflated and hemostasis was still noted.     Following the procedure the umbilical hasson was removed after intra-abdominal carbon dioxide was expressed. The fascia was reaproximated by tying the 0 vicryl suture.   The 5mm skin incision was closed with dermabond.  The 10 mm incision was closed with a  subcuticular suture of 3-0 monocryl. The intrauterine manipulator was then removed. Her mirena was removed.    The tenaculum site was oozing and made henmostatic with silver nitrate.  Pt had large amount of vaginal bleeding from the uterus.  The vagina and cervix was examined.  No lacerations were noted.  The bleeding then stopped.   Instrument, sponge, and needle counts were correct prior to abdominal closure and at the conclusion of the case.  Findings: See above Estimated Blood Loss:  Minimal         Drains: none         Total IV Fluids:Intravenous fluids were administered, normal saline  m         Specimens: none              Complications:  None; patient tolerated the procedure well.         Disposition: PACU - hemodynamically stable.         Condition: stable

## 2015-08-09 NOTE — H&P (View-Only) (Signed)
Carmen Simmons is a 39 y.o.  P: 4-0-2-4 present for tubal sterilization because of her desire for permanent sterilization.  The patient has five children to care for and a Mirena IUD that will expire in January 2017.  In the past she has used oral contraceptives, Nuvaring,  Depo Provera and condoms for contraception but she and her husband do not desire more children. The patient consents for bilateral tubal sterilization.  Past Medical History  OB History: G: 6;   P: 4-0-3-4;   SVB: 1999 and 2003 and C-section: 2011 (twins)  GYN History: menarche: 39 YO     LMP: none due to Mirena IUD     Had abnormal PAP smears (2001)  treated with Cryotherapy and LEEP but have been normal since;  Last PAP smear: 2013-normal  Medical History: SVT, Anemia, Chronic Sinusitis, Eczema and Umbilical Hernia  Surgical History:   2001  Cervical Cryotherapy and Loop Excision Electrosurgical Procedure;      2011 C-section For Twins Denies problems with anesthesia or history of blood transfusions  Family History: Hypertension  Social History:  Married and works in Airline pilot;  Denies tobacco or alcohol intake   Medication  Mirena IUD expires January 2017 Multivitamins daily  No Known Allergies   Denies sensitivity to peanuts, shellfish, soy, latex or adhesives.   ROS: Admits to glasses, chronic sinusitis with accompanying headache but  denies  vision changes,  dysphagia, tinnitus, dizziness, hoarseness, cough,  chest pain, shortness of breath, nausea, vomiting, diarrhea,constipation,  urinary frequency, urgency  dysuria, hematuria, vaginitis symptoms, pelvic pain, swelling of joints,easy bruising,  myalgias, arthralgias, skin rashes, unexplained weight loss and except as is mentioned in the history of present illness, patient's review of systems is otherwise negative.    Physical Exam Bp: 114/70   Weight: 218 lbs    Height: 5'10"   BMI: 31.3   Neck: supple without masses or thyromegaly Lungs: clear to  auscultation Heart: regular rate and rhythm Abdomen: soft, non-tender and no organomegaly Pelvic:EGBUS- wnl; vagina-normal rugae; uterus-normal size, cervix:  without lesions or motion tenderness;IUD strings visible ; adnexae-no tenderness or masses Extremities:  no clubbing, cyanosis or edema   Assesment: Desire for Sterilization   Disposition:  A discussion was held with patient regarding the indication for her procedure(s) along with the risks, which include but are not limited to: reaction to anesthesia, damage to adjacent organs, infection and excessive bleeding.  The patient verbalized understanding of these risks and has consented to proceed with Bilateral Salpingectomy with Removal of IUD at [facility]   CSN# [id number]   Carmen Simmons J. Lowell Guitar, PA-C  for Dr. Pierre Bali. Dillard

## 2015-08-09 NOTE — Transfer of Care (Signed)
Immediate Anesthesia Transfer of Care Note  Patient: Carmen Simmons  Procedure(s) Performed: Procedure(s): LAPAROSCOPIC BILATERAL SALPINGECTOMY (Bilateral) INTRAUTERINE DEVICE (IUD) REMOVAL (N/A)  Patient Location: PACU  Anesthesia Type:General  Level of Consciousness: sedated  Airway & Oxygen Therapy: Patient Spontanous Breathing and Patient connected to nasal cannula oxygen  Post-op Assessment: Report given to RN and Post -op Vital signs reviewed and stable  Post vital signs: stable  Last Vitals:  Filed Vitals:   08/09/15 0840  BP: 139/96  Pulse: 68  Temp: 36.4 C  Resp: 20    Complications: No apparent anesthesia complications

## 2015-08-09 NOTE — Interval H&P Note (Signed)
History and Physical Interval Note:  08/09/2015 9:39 AM  Carmen Gave  has presented today for surgery, with the diagnosis of Desires Surgical Sterilization  The various methods of treatment have been discussed with the patient and family. After consideration of risks, benefits and other options for treatment, the patient has consented to  Procedure(s): LAPAROSCOPIC BILATERAL SALPINGECTOMY (Bilateral) as a surgical intervention .  The patient's history has been reviewed, patient examined, no change in status, stable for surgery.  I have reviewed the patient's chart and labs.  Questions were answered to the patient's satisfaction.     [name]

## 2015-08-09 NOTE — Anesthesia Preprocedure Evaluation (Addendum)
Anesthesia Evaluation  Patient identified by MRN, date of birth, ID band Patient awake    Reviewed: Allergy & Precautions, NPO status , Patient's Chart, lab work & pertinent test results  History of Anesthesia Complications Negative for: history of anesthetic complications  Airway Mallampati: II  TM Distance: >3 FB Neck ROM: Full    Dental no notable dental hx. (+) Dental Advisory Given   Pulmonary neg pulmonary ROS,    Pulmonary exam normal breath sounds clear to auscultation       Cardiovascular negative cardio ROS Normal cardiovascular exam Rhythm:Regular Rate:Normal     Neuro/Psych  Headaches, negative psych ROS   GI/Hepatic negative GI ROS, Neg liver ROS,   Endo/Other  obesity  Renal/GU negative Renal ROS  negative genitourinary   Musculoskeletal negative musculoskeletal ROS (+)   Abdominal   Peds negative pediatric ROS (+)  Hematology  (+) anemia ,   Anesthesia Other Findings   Reproductive/Obstetrics negative OB ROS                             Anesthesia Physical Anesthesia Plan  ASA: II  Anesthesia Plan: General   Post-op Pain Management:    Induction: Intravenous  Airway Management Planned: Oral ETT  Additional Equipment:   Intra-op Plan:   Post-operative Plan: Extubation in OR  Informed Consent: I have reviewed the patients History and Physical, chart, labs and discussed the procedure including the risks, benefits and alternatives for the proposed anesthesia with the patient or authorized representative who has indicated his/her understanding and acceptance.   Dental advisory given  Plan Discussed with: CRNA  Anesthesia Plan Comments:         Anesthesia Quick Evaluation  

## 2015-08-10 ENCOUNTER — Encounter (HOSPITAL_COMMUNITY): Payer: Self-pay | Admitting: Obstetrics and Gynecology

## 2016-06-18 IMAGING — CT CT ABD-PELV W/ CM
2 of 4 series · 16 of 46 positions shown, 18 images · IV contrast (omnipaque)
Comparison: None.

CLINICAL DATA: RIGHT lower quadrant abdominal pain. Two episodes of
vomiting. One of the diarrhea.

EXAM:
CT ABDOMEN AND PELVIS WITH CONTRAST
TECHNIQUE: Multidetector CT imaging of the abdomen and pelvis was performed
using the standard protocol following bolus administration of
intravenous contrast.
CONTRAST:  100mL OMNIPAQUE IOHEXOL 300 MG/ML  SOLN

[Series 2: abd/pel with · axial · 0.95mm/px · z∈[-189,+241]mm · 13 of 96 slices shown, 15 images]
[im 5/96  soft-tissue]
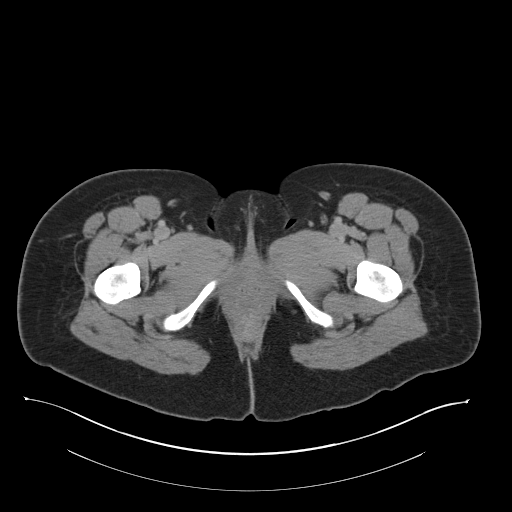
[im 5/96  bone]
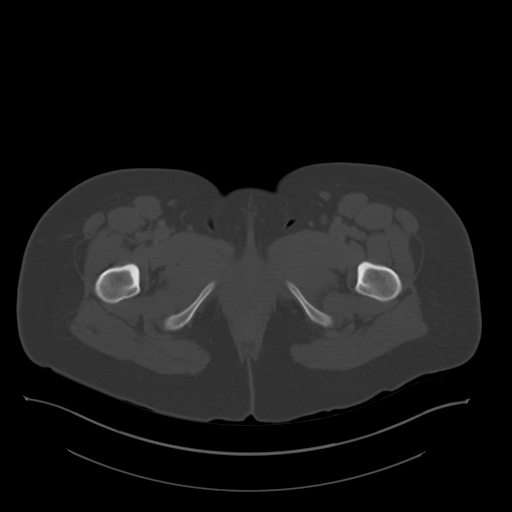
[im 13/96  soft-tissue]
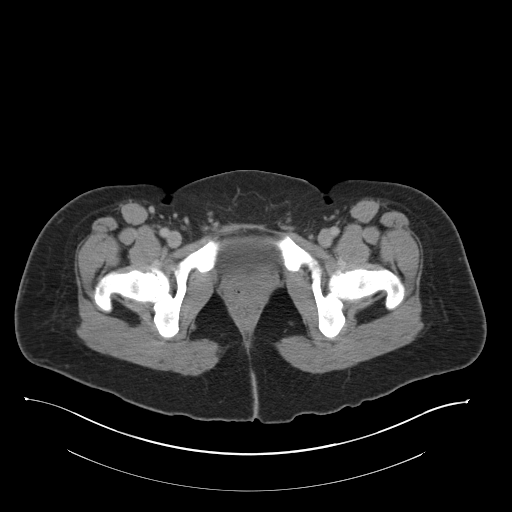
[im 21/96  soft-tissue]
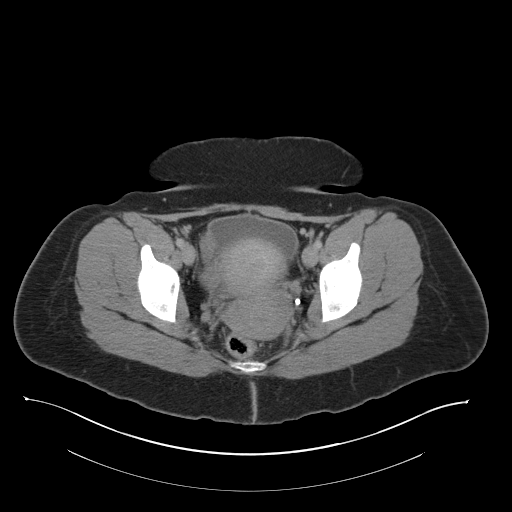
[im 25/96  soft-tissue]
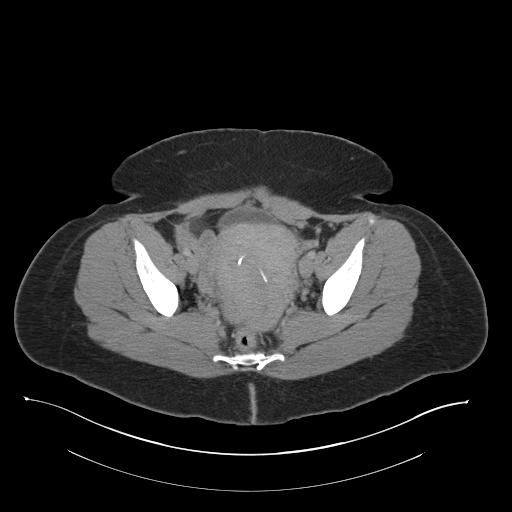
[im 34/96  soft-tissue]
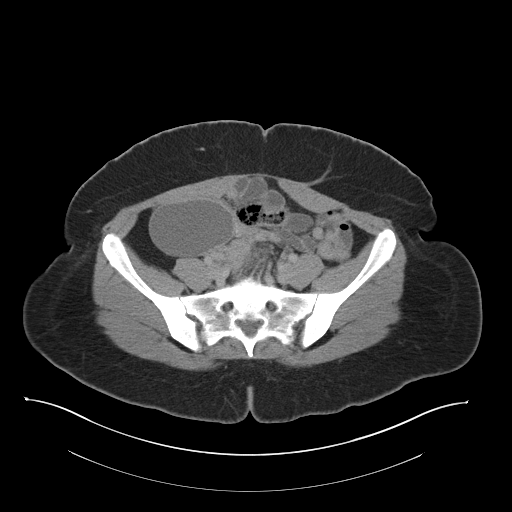
[im 42/96  soft-tissue]
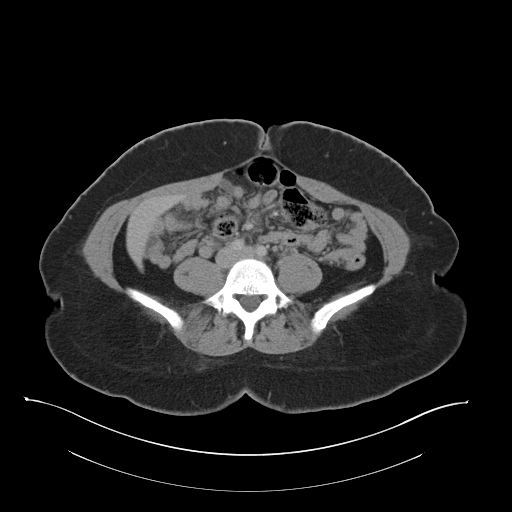
[im 50/96  soft-tissue]
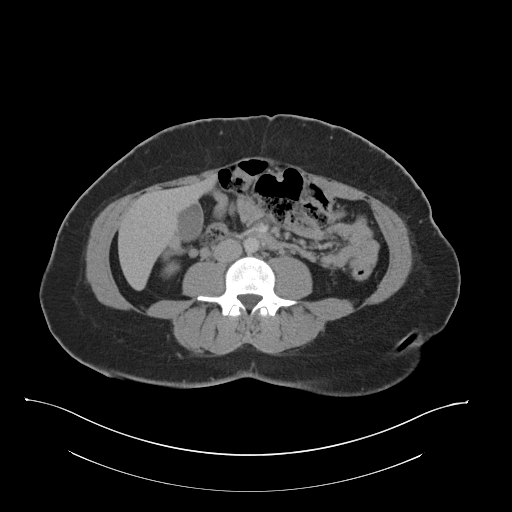
[im 54/96  soft-tissue]
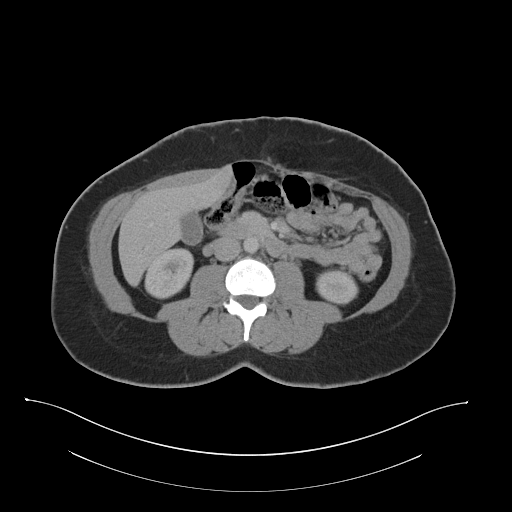
[im 62/96  soft-tissue]
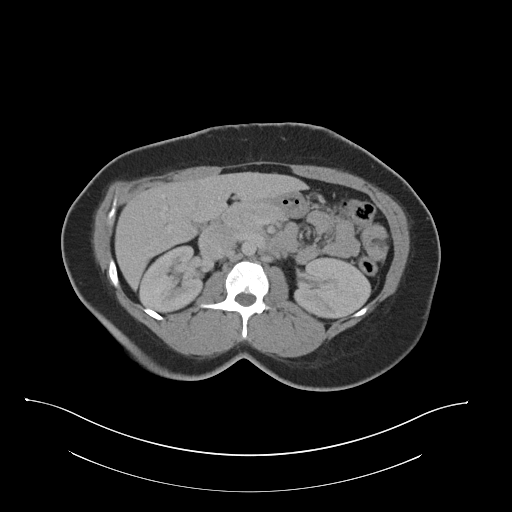
[im 62/96  bone]
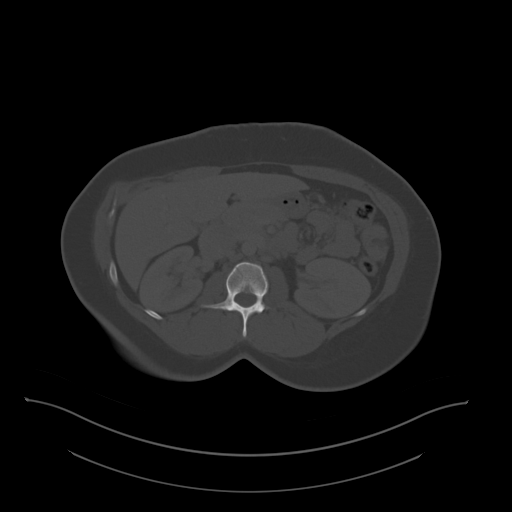
[im 71/96  soft-tissue]
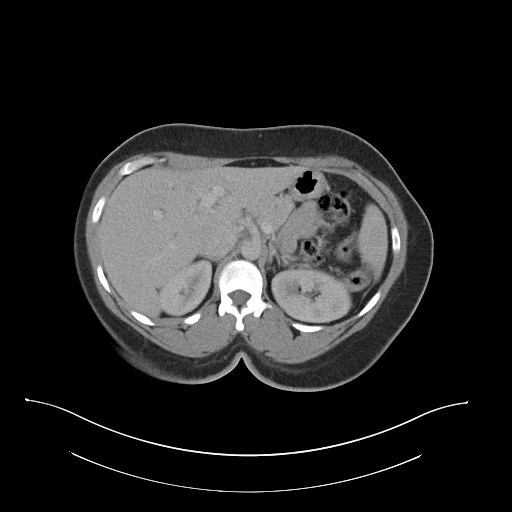
[im 75/96  soft-tissue]
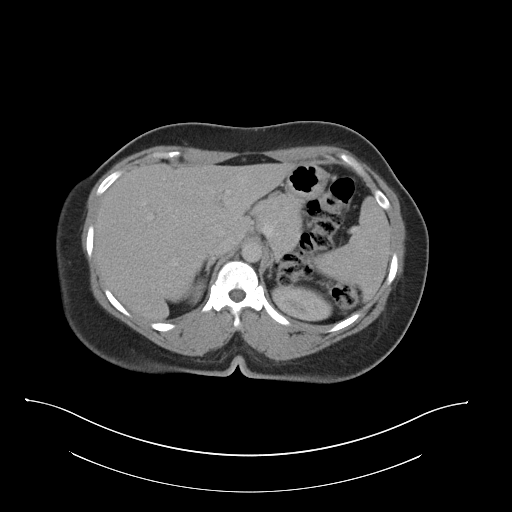
[im 83/96  soft-tissue]
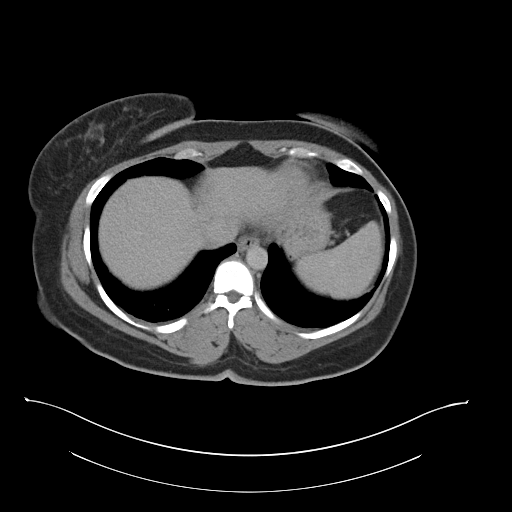
[im 91/96  soft-tissue]
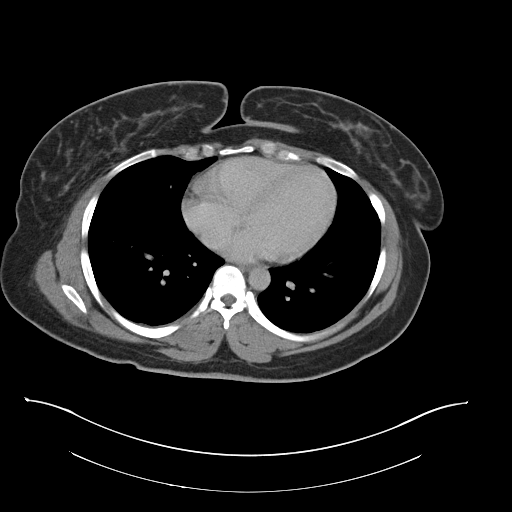

[Series 3: coronal a/|p · coronal · 0.81mm/px · 3 of 112 slices shown]
[im 50/112  soft-tissue]
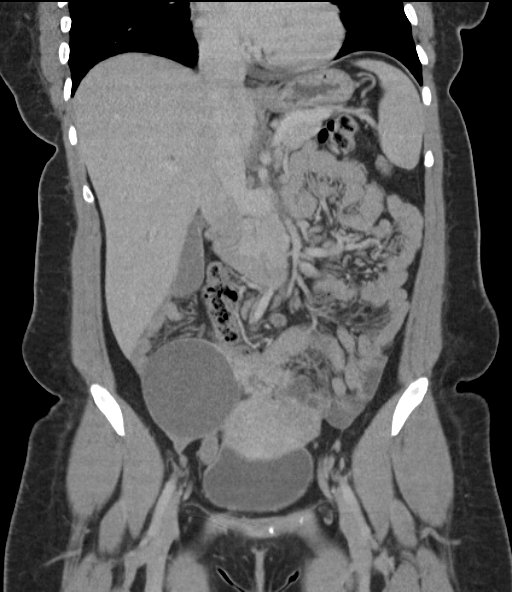
[im 62/112  soft-tissue]
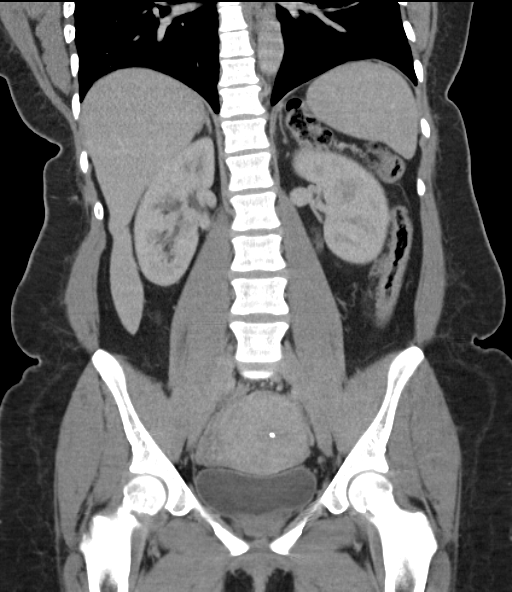
[im 75/112  soft-tissue]
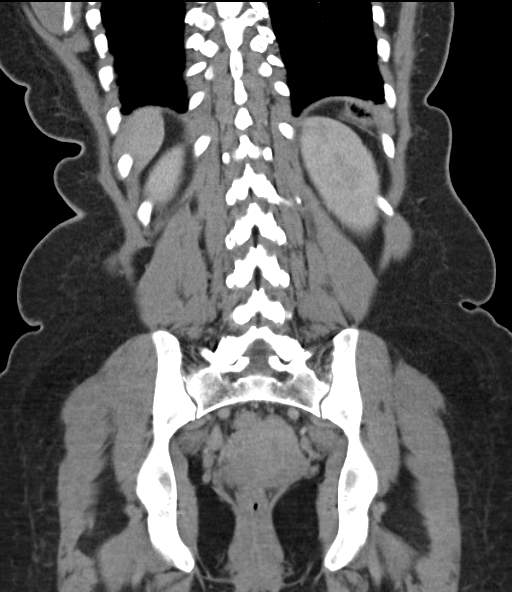

[16 of 46 positions shown; findings below may reference images not displayed]

FINDINGS: Lower chest: Lung bases are clear.

Hepatobiliary: No focal hepatic lesion. No biliary duct dilatation.
Gallbladder is normal. Common bile duct is normal.

Pancreas: Pancreas is normal. No ductal dilatation. No pancreatic
inflammation.

Spleen: Normal spleen

Adrenals/urinary tract: Adrenal glands and kidneys are normal. The
ureters and bladder normal.

Stomach/Bowel: Stomach and small bowel are normal. The cecum is
midline in the suprapubic region. Normal appendix is identified on
images 67 through 72 of sagittal series 4. The more distal colon is
normal. Rectum is normal.

Vascular/Lymphatic: Abdominal aorta is normal caliber. There is no
retroperitoneal or periportal lymphadenopathy. No pelvic
lymphadenopathy.

Reproductive: The uterus is normal. IUD in expected location within
the endometrial canal. The LEFT ovary is not well identified. The
RIGHT ovary has an associated large round simple fluid attenuation
cystic mass. This large round cystic mass measures 8.1 by 5.4 x
cm.

Musculoskeletal: No aggressive osseous lesion.

Other: No free fluid.
IMPRESSION: 1. Large cystic mass associated with the RIGHT ovary measures 8.2 cm
in maximum diameter. Recommend non emergent GYN surgical
consultation.
2. Cecum is midline.  Normal appendix identified.
3. IUD in expected location.

## 2016-11-04 HISTORY — PX: OTHER SURGICAL HISTORY: SHX169

## 2017-01-26 ENCOUNTER — Ambulatory Visit (HOSPITAL_COMMUNITY)
Admission: EM | Admit: 2017-01-26 | Discharge: 2017-01-26 | Disposition: A | Discharge status: Home / Self Care | Payer: 59 | Attending: Family Medicine | Admitting: Family Medicine

## 2017-01-26 ENCOUNTER — Encounter (HOSPITAL_COMMUNITY): Payer: Self-pay | Admitting: *Deleted

## 2017-01-26 DIAGNOSIS — L509 Urticaria, unspecified: Secondary | ICD-10-CM | POA: Diagnosis not present

## 2017-01-26 DIAGNOSIS — T7840XA Allergy, unspecified, initial encounter: Secondary | ICD-10-CM | POA: Diagnosis not present

## 2017-01-26 MED ORDER — DIPHENHYDRAMINE HCL 50 MG/ML IJ SOLN
INTRAMUSCULAR | Status: AC
  Filled 2017-01-26: qty 1

## 2017-01-26 MED ORDER — METHYLPREDNISOLONE ACETATE 80 MG/ML IJ SUSP
INTRAMUSCULAR | Status: AC
  Filled 2017-01-26: qty 1

## 2017-01-26 MED ORDER — METHYLPREDNISOLONE ACETATE 80 MG/ML IJ SUSP
80.0000 mg | Freq: Once | INTRAMUSCULAR | Status: AC
  Administered 2017-01-26: 80 mg via INTRAMUSCULAR

## 2017-01-26 MED ORDER — DIPHENHYDRAMINE HCL 50 MG/ML IJ SOLN
50.0000 mg | Freq: Once | INTRAMUSCULAR | Status: AC
  Administered 2017-01-26: 50 mg via INTRAMUSCULAR

## 2017-01-26 NOTE — ED Provider Notes (Signed)
[facility]    CSN: [id number] Arrival date & time: 01/26/17  1952     History   Chief Complaint Chief Complaint  Patient presents with  . Allergic Reaction    HPI Carmen Simmons is a 41 y.o. female.   This a 41 year old woman who was out walking 15 minutes before arrival when she noticed some facial swelling developing, thick tongue, and itchy whelps on her chest. She's having no wheezing.      Past Medical History:  Diagnosis Date  . Allergy    seasonal allergies  . Anemia   . Chronic headache    stress related - otc meds prn  . Eczema     Patient Active Problem List   Diagnosis Date Noted  . Diastasis recti 02/08/2013  . Umbilical hernia 02/08/2013  . URI (upper respiratory infection) 09/24/2011  . Sinusitis 09/24/2011    Past Surgical History:  Procedure Laterality Date  . CESAREAN SECTION     x 1 - twins  . IUD REMOVAL N/A 08/09/2015   Procedure: INTRAUTERINE DEVICE (IUD) REMOVAL;  Surgeon: Jaymes GraffNaima Dillard, MD;  Location: [facility];  Service: Gynecology;  Laterality: N/A;  . LAPAROSCOPIC BILATERAL SALPINGECTOMY Bilateral 08/09/2015   Procedure: LAPAROSCOPIC BILATERAL SALPINGECTOMY;  Surgeon: Jaymes GraffNaima Dillard, MD;  Location: [facility];  Service: Gynecology;  Laterality: Bilateral;  . WISDOM TOOTH EXTRACTION      OB History    No data available       Home Medications    Prior to Admission medications   Medication Sig Start Date End Date Taking? Authorizing Provider  cetirizine (ZYRTEC) 10 MG tablet Take 10 mg by mouth daily as needed for allergies.    Historical Provider, MD  doxycycline (VIBRA-TABS) 100 MG tablet Take 1 tablet (100 mg total) by mouth 2 (two) times daily. 08/09/15   Henreitta LeberElmira Powell, PA-C  HYDROcodone-acetaminophen (NORCO/VICODIN) 5-325 MG tablet 1-2 every 6 hours as needed for pain 08/09/15   Henreitta LeberElmira Powell, PA-C  ibuprofen (ADVIL,MOTRIN) 600 MG tablet 1 po  pc every 6 hours with food x 3 days then as needed for pain 08/09/15   Henreitta LeberElmira  Powell, PA-C  Multiple Vitamin (MULTIVITAMIN) tablet Take 1 tablet by mouth daily.      Historical Provider, MD    Family History Family History  Problem Relation Age of Onset  . Hypertension Mother     Social History Social History  Substance Use Topics  . Smoking status: Never Smoker  . Smokeless tobacco: Never Used  . Alcohol use Yes     Allergies   Patient has no known allergies.   Review of Systems Review of Systems  Constitutional: Negative.   HENT: Positive for facial swelling and trouble swallowing.   Respiratory: Negative.   Cardiovascular: Negative.   Skin: Positive for rash.  Neurological: Negative.   All other systems reviewed and are negative.    Physical Exam Triage Vital Signs ED Triage Vitals [01/26/17 1954]  Enc Vitals Group     BP (!) 162/106     Pulse      Resp 16     Temp      Temp src      SpO2 98 %     Weight      Height      Head Circumference      Peak Flow      Pain Score      Pain Loc      Pain Edu?  Excl. in GC?    No data found.   Updated Vital Signs BP (!) 162/106   Temp 98 F (36.7 C) (Oral)   Resp 16   LMP 01/16/2017 (Exact Date)   SpO2 98%    Physical Exam  Constitutional: She is oriented to person, place, and time. She appears well-developed and well-nourished.  HENT:  Right Ear: External ear normal.  Left Ear: External ear normal.  Tongue is mildly enlarged, there is mild swelling of the lips as well.  Eyes: Conjunctivae and EOM are normal. Pupils are equal, round, and reactive to light.  Neck: Normal range of motion. Neck supple.  Cardiovascular: Normal rate, regular rhythm and normal heart sounds.   Pulmonary/Chest: Effort normal and breath sounds normal.  Musculoskeletal: Normal range of motion.  Neurological: She is alert and oriented to person, place, and time.  Skin: Skin is warm and dry.  Urticarial rash on upper chest  Nursing note and vitals reviewed.  Symptoms gradually lessened as  patient remained in the department for 45 minutes.  UC Treatments / Results  Labs (all labs ordered are listed, but only abnormal results are displayed) Labs Reviewed - No data to display  EKG  EKG Interpretation None       Radiology No results found.  Procedures Procedures (including critical care time)  Medications Ordered in UC Medications  methylPREDNISolone acetate (DEPO-MEDROL) injection 80 mg (80 mg Intramuscular Given 01/26/17 2002)  diphenhydrAMINE (BENADRYL) injection 50 mg (50 mg Intramuscular Given 01/26/17 2003)     Initial Impression / Assessment and Plan / UC Course  I have reviewed the triage vital signs and the nursing notes.  Pertinent labs & imaging results that were available during my care of the patient were reviewed by me and considered in my medical decision making (see chart for details).     Final Clinical Impressions(s) / UC Diagnoses   Final diagnoses:  Allergic reaction, initial encounter  Hives    New Prescriptions New Prescriptions   No medications on file     Elvina Sidle, MD 01/26/17 2047

## 2017-01-26 NOTE — Discharge Instructions (Signed)
The medicine that you received tonight should take care of the problem. You may want to pick up some Benadryl just in case some of the itching comes back in the middle of the night.

## 2017-01-26 NOTE — ED Triage Notes (Signed)
C/O tongue swelling, hives to neck and face, throat swelling x 20 min while exercising outside.

## 2017-03-03 DIAGNOSIS — J309 Allergic rhinitis, unspecified: Secondary | ICD-10-CM | POA: Diagnosis not present

## 2017-03-03 DIAGNOSIS — J3089 Other allergic rhinitis: Secondary | ICD-10-CM | POA: Diagnosis not present

## 2017-03-03 DIAGNOSIS — H1045 Other chronic allergic conjunctivitis: Secondary | ICD-10-CM | POA: Diagnosis not present

## 2017-04-21 DIAGNOSIS — M6749 Ganglion, multiple sites: Secondary | ICD-10-CM | POA: Diagnosis not present

## 2017-04-21 DIAGNOSIS — M6789 Other specified disorders of synovium and tendon, multiple sites: Secondary | ICD-10-CM | POA: Diagnosis not present

## 2017-04-21 DIAGNOSIS — M25561 Pain in right knee: Secondary | ICD-10-CM | POA: Diagnosis not present

## 2017-11-15 DIAGNOSIS — J018 Other acute sinusitis: Secondary | ICD-10-CM | POA: Diagnosis not present

## 2017-11-26 DIAGNOSIS — N62 Hypertrophy of breast: Secondary | ICD-10-CM | POA: Diagnosis not present

## 2017-11-26 DIAGNOSIS — M549 Dorsalgia, unspecified: Secondary | ICD-10-CM | POA: Diagnosis not present

## 2017-11-26 DIAGNOSIS — M542 Cervicalgia: Secondary | ICD-10-CM | POA: Diagnosis not present

## 2017-12-14 DIAGNOSIS — J019 Acute sinusitis, unspecified: Secondary | ICD-10-CM | POA: Diagnosis not present

## 2017-12-17 DIAGNOSIS — M67461 Ganglion, right knee: Secondary | ICD-10-CM | POA: Diagnosis not present

## 2018-06-01 DIAGNOSIS — Z0000 Encounter for general adult medical examination without abnormal findings: Secondary | ICD-10-CM | POA: Diagnosis not present

## 2018-06-01 DIAGNOSIS — Z23 Encounter for immunization: Secondary | ICD-10-CM | POA: Diagnosis not present

## 2018-06-01 DIAGNOSIS — Z13220 Encounter for screening for lipoid disorders: Secondary | ICD-10-CM | POA: Diagnosis not present

## 2018-07-24 DIAGNOSIS — Z1231 Encounter for screening mammogram for malignant neoplasm of breast: Secondary | ICD-10-CM | POA: Diagnosis not present

## 2019-12-30 ENCOUNTER — Other Ambulatory Visit: Payer: Self-pay | Admitting: Family Medicine

## 2019-12-30 DIAGNOSIS — R102 Pelvic and perineal pain: Secondary | ICD-10-CM

## 2020-02-18 ENCOUNTER — Other Ambulatory Visit: Payer: Self-pay | Admitting: Obstetrics and Gynecology

## 2020-11-04 HISTORY — PX: ABDOMINOPLASTY: SUR9

## 2022-07-03 DIAGNOSIS — R519 Headache, unspecified: Secondary | ICD-10-CM | POA: Insufficient documentation

## 2022-09-05 ENCOUNTER — Ambulatory Visit: Payer: 59 | Admitting: Family Medicine

## 2022-09-19 ENCOUNTER — Emergency Department (HOSPITAL_COMMUNITY)
Admission: EM | Admit: 2022-09-19 | Discharge: 2022-09-19 | Disposition: A | Discharge status: Home / Self Care | Payer: 59 | Attending: Emergency Medicine | Admitting: Emergency Medicine

## 2022-09-19 ENCOUNTER — Emergency Department (HOSPITAL_COMMUNITY): Payer: 59

## 2022-09-19 ENCOUNTER — Encounter (HOSPITAL_COMMUNITY): Payer: Self-pay | Admitting: Emergency Medicine

## 2022-09-19 ENCOUNTER — Other Ambulatory Visit: Payer: Self-pay

## 2022-09-19 DIAGNOSIS — N390 Urinary tract infection, site not specified: Secondary | ICD-10-CM | POA: Diagnosis not present

## 2022-09-19 DIAGNOSIS — R102 Pelvic and perineal pain: Secondary | ICD-10-CM

## 2022-09-19 DIAGNOSIS — N939 Abnormal uterine and vaginal bleeding, unspecified: Secondary | ICD-10-CM | POA: Insufficient documentation

## 2022-09-19 DIAGNOSIS — N888 Other specified noninflammatory disorders of cervix uteri: Secondary | ICD-10-CM

## 2022-09-19 LAB — URINALYSIS, ROUTINE W REFLEX MICROSCOPIC
Bilirubin Urine: NEGATIVE
Glucose, UA: NEGATIVE mg/dL
Ketones, ur: NEGATIVE mg/dL
Nitrite: NEGATIVE
Protein, ur: 30 mg/dL — AB
Specific Gravity, Urine: 1.015 (ref 1.005–1.030)
pH: 7 (ref 5.0–8.0)

## 2022-09-19 LAB — BASIC METABOLIC PANEL
Anion gap: 9 (ref 5–15)
BUN: 10 mg/dL (ref 6–20)
CO2: 22 mmol/L (ref 22–32)
Calcium: 8.7 mg/dL — ABNORMAL LOW (ref 8.9–10.3)
Chloride: 103 mmol/L (ref 98–111)
Creatinine, Ser: 0.67 mg/dL (ref 0.44–1.00)
GFR, Estimated: >60 mL/min (ref >60)
Glucose, Bld: 118 mg/dL — ABNORMAL HIGH (ref 70–99)
Potassium: 3.1 mmol/L — ABNORMAL LOW (ref 3.5–5.1)
Sodium: 134 mmol/L — ABNORMAL LOW (ref 135–145)

## 2022-09-19 LAB — WET PREP, GENITAL
Clue Cells Wet Prep HPF POC: NONE SEEN
Sperm: NONE SEEN
Trich, Wet Prep: NONE SEEN
WBC, Wet Prep HPF POC: >=10 — AB (ref <10)
Yeast Wet Prep HPF POC: NONE SEEN

## 2022-09-19 LAB — CBC WITH DIFFERENTIAL/PLATELET
Abs Immature Granulocytes: 0.02 10*3/uL (ref 0.00–0.07)
Basophils Absolute: 0 10*3/uL (ref 0.0–0.1)
Basophils Relative: 0 %
Eosinophils Absolute: 0.1 10*3/uL (ref 0.0–0.5)
Eosinophils Relative: 1 %
HCT: 38.8 % (ref 36.0–46.0)
Hemoglobin: 12.1 g/dL (ref 12.0–15.0)
Immature Granulocytes: 0 %
Lymphocytes Relative: 30 %
Lymphs Abs: 2.4 10*3/uL (ref 0.7–4.0)
MCH: 26.5 pg (ref 26.0–34.0)
MCHC: 31.2 g/dL (ref 30.0–36.0)
MCV: 85.1 fL (ref 80.0–100.0)
Monocytes Absolute: 0.4 10*3/uL (ref 0.1–1.0)
Monocytes Relative: 6 %
Neutro Abs: 5 10*3/uL (ref 1.7–7.7)
Neutrophils Relative %: 63 %
Platelets: 174 10*3/uL (ref 150–400)
RBC: 4.56 MIL/uL (ref 3.87–5.11)
RDW: 14.4 % (ref 11.5–15.5)
WBC: 8 10*3/uL (ref 4.0–10.5)
nRBC: 0 % (ref 0.0–0.2)

## 2022-09-19 MED ORDER — PHENAZOPYRIDINE HCL 200 MG PO TABS: 200.0000 mg | ORAL_TABLET | Freq: Three times a day (TID) | ORAL | 0 refills | Status: DC | PRN

## 2022-09-19 MED ORDER — ONDANSETRON HCL 4 MG/2ML IJ SOLN
4.0000 mg | Freq: Once | INTRAMUSCULAR | Status: AC
  Administered 2022-09-19: 4 mg via INTRAVENOUS
  Filled 2022-09-19: qty 2

## 2022-09-19 MED ORDER — CEPHALEXIN 500 MG PO CAPS
500.0000 mg | ORAL_CAPSULE | Freq: Once | ORAL | Status: AC
  Administered 2022-09-19: 500 mg via ORAL
  Filled 2022-09-19: qty 1

## 2022-09-19 MED ORDER — MELOXICAM 15 MG PO TABS: 15.0000 mg | ORAL_TABLET | Freq: Every day | ORAL | 0 refills | Status: DC

## 2022-09-19 MED ORDER — KETOROLAC TROMETHAMINE 30 MG/ML IJ SOLN
30.0000 mg | Freq: Once | INTRAMUSCULAR | Status: AC
  Administered 2022-09-19: 30 mg via INTRAVENOUS
  Filled 2022-09-19: qty 1

## 2022-09-19 MED ORDER — ONDANSETRON 8 MG PO TBDP: ORAL_TABLET | ORAL | 0 refills | Status: DC

## 2022-09-19 MED ORDER — CEPHALEXIN 500 MG PO CAPS: 500.0000 mg | ORAL_CAPSULE | Freq: Four times a day (QID) | ORAL | 0 refills | Status: DC

## 2022-09-19 MED ORDER — MAGNESIUM SULFATE 2 GM/50ML IV SOLN
2.0000 g | Freq: Once | INTRAVENOUS | Status: AC
  Administered 2022-09-19: 2 g via INTRAVENOUS
  Filled 2022-09-19: qty 50

## 2022-09-19 NOTE — ED Provider Notes (Signed)
[facility]-EMERGENCY DEPT Provider Note   CSN: [id number] Arrival date & time: 09/19/22  0423     History  Chief Complaint  Patient presents with   Flank Pain    Carmen Simmons is a 46 y.o. female.  The history is provided by the patient.  Flank Pain This is a new problem. The current episode started 3 to 5 hours ago. The problem occurs constantly. The problem has not changed since onset.Pertinent negatives include no chest pain, no headaches and no shortness of breath. Nothing aggravates the symptoms. Nothing relieves the symptoms. The treatment provided no relief.  Patient with treatment for UTI at the beginning of the month presents with pressure in pelvic and feeling of needing to pass "concrete".  Patient has had some frequency but no dysuria.  No fevers, no diarrhea.  Has had her menstrual cycle until yesterday.  The patient does not currently have a gynecologist and has not had a pap smear this year.       Home Medications Prior to Admission medications   Medication Sig Start Date End Date Taking? Authorizing Provider  cephALEXin (KEFLEX) 500 MG capsule Take 1 capsule (500 mg total) by mouth 4 (four) times daily. 09/19/22  Yes Merrel Crabbe, MD  meloxicam (MOBIC) 15 MG tablet Take 1 tablet (15 mg total) by mouth daily. 09/19/22  Yes Clemence Lengyel, MD  ondansetron (ZOFRAN-ODT) 8 MG disintegrating tablet  ODT q8 hours prn nausea 09/19/22  Yes Mitch Arquette, MD  phenazopyridine (PYRIDIUM) 200 MG tablet Take 1 tablet (200 mg total) by mouth 3 (three) times daily as needed for pain. 09/19/22  Yes Nettie Cromwell, MD  cetirizine (ZYRTEC) 10 MG tablet Take 10 mg by mouth daily as needed for allergies.    [provider]  doxycycline (VIBRA-TABS) 100 MG tablet Take 1 tablet (100 mg total) by mouth 2 (two) times daily. 08/09/15   Henreitta Leber, PA-C  HYDROcodone-acetaminophen (NORCO/VICODIN) 5-325 MG tablet 1-2 every 6 hours as needed for pain  08/09/15   Henreitta Leber, PA-C  ibuprofen (ADVIL,MOTRIN) 600 MG tablet 1 po  pc every 6 hours with food x 3 days then as needed for pain 08/09/15   Henreitta Leber, PA-C  Multiple Vitamin (MULTIVITAMIN) tablet Take 1 tablet by mouth daily.      [provider]      Allergies    Patient has no known allergies.    Review of Systems   Review of Systems  Constitutional:  Negative for fever.  HENT:  Negative for facial swelling.   Respiratory:  Negative for shortness of breath.   Cardiovascular:  Negative for chest pain.  Gastrointestinal:  Negative for vomiting.  Genitourinary:  Positive for flank pain and frequency. Negative for dysuria.  Neurological:  Negative for headaches.  All other systems reviewed and are negative.   Physical Exam Updated Vital Signs BP (!) 160/100   Pulse 83   Temp 97.6 F (36.4 C)   Resp 18   Ht  (1.778 m)   Wt 96 kg   LMP 09/15/2022 Comment: bilateral salpingectomy per chart  SpO2 100%   BMI 30.37 kg/m  Physical Exam Vitals and nursing note reviewed. Exam conducted with a chaperone present.  Constitutional:      General: She is not in acute distress.    Appearance: Normal appearance. She is well-developed.  HENT:     Head: Normocephalic and atraumatic.     Nose: Nose normal.  Eyes:  Pupils: Pupils are equal, round, and reactive to light.  Cardiovascular:     Rate and Rhythm: Normal rate and regular rhythm.     Pulses: Normal pulses.     Heart sounds: Normal heart sounds.  Pulmonary:     Effort: Pulmonary effort is normal. No respiratory distress.     Breath sounds: Normal breath sounds.  Abdominal:     General: Bowel sounds are normal. There is no distension.     Palpations: Abdomen is soft.     Tenderness: There is no abdominal tenderness. There is no guarding or rebound.  Genitourinary:    Vagina: No vaginal discharge.     Comments: Scant vaginal bleeding on exam, no discharge, slightly enlarged cervix without signs of  infection Musculoskeletal:        General: Normal range of motion.     Cervical back: Neck supple.  Skin:    General: Skin is warm and dry.     Capillary Refill: Capillary refill takes less than 2 seconds.     Findings: No erythema or rash.  Neurological:     General: No focal deficit present.     Mental Status: She is alert and oriented to person, place, and time.     Deep Tendon Reflexes: Reflexes normal.  Psychiatric:        Mood and Affect: Mood normal.     ED Results / Procedures / Treatments   Labs (all labs ordered are listed, but only abnormal results are displayed) Results for orders placed or performed during the hospital encounter of 09/19/22  CBC with Differential  Result Value Ref Range   WBC 8.0 4.0 - 10.5 K/uL   RBC 4.56 3.87 - 5.11 MIL/uL   Hemoglobin 12.1 12.0 - 15.0 g/dL   HCT 16.138.8 09.636.0 - 04.546.0 %   MCV 85.1 80.0 - 100.0 fL   MCH 26.5 26.0 - 34.0 pg   MCHC 31.2 30.0 - 36.0 g/dL   RDW 40.914.4 81.111.5 - 91.415.5 %   Platelets 174 150 - 400 K/uL   nRBC 0.0 0.0 - 0.2 %   Neutrophils Relative % 63 %   Neutro Abs 5.0 1.7 - 7.7 K/uL   Lymphocytes Relative 30 %   Lymphs Abs 2.4 0.7 - 4.0 K/uL   Monocytes Relative 6 %   Monocytes Absolute 0.4 0.1 - 1.0 K/uL   Eosinophils Relative 1 %   Eosinophils Absolute 0.1 0.0 - 0.5 K/uL   Basophils Relative 0 %   Basophils Absolute 0.0 0.0 - 0.1 K/uL   Immature Granulocytes 0 %   Abs Immature Granulocytes 0.02 0.00 - 0.07 K/uL  Basic metabolic panel  Result Value Ref Range   Sodium 134 (L) 135 - 145 mmol/L   Potassium 3.1 (L) 3.5 - 5.1 mmol/L   Chloride 103 98 - 111 mmol/L   CO2 22 22 - 32 mmol/L   Glucose, Bld 118 (H) 70 - 99 mg/dL   BUN 10 6 - 20 mg/dL   Creatinine, Ser 7.820.67 0.44 - 1.00 mg/dL   Calcium 8.7 (L) 8.9 - 10.3 mg/dL   GFR, Estimated >95>60 >62>60 mL/min   Anion gap 9 5 - 15  Urinalysis, Routine w reflex microscopic Urine, Clean Catch  Result Value Ref Range   Color, Urine YELLOW YELLOW   APPearance CLEAR CLEAR    Specific Gravity, Urine 1.015 1.005 - 1.030   pH 7.0 5.0 - 8.0   Glucose, UA NEGATIVE NEGATIVE mg/dL   Hgb urine dipstick LARGE (  A) NEGATIVE   Bilirubin Urine NEGATIVE NEGATIVE   Ketones, ur NEGATIVE NEGATIVE mg/dL   Protein, ur 30 (A) NEGATIVE mg/dL   Nitrite NEGATIVE NEGATIVE   Leukocytes,Ua TRACE (A) NEGATIVE   RBC / HPF 0-5 0 - 5 RBC/hpf   WBC, UA 0-5 0 - 5 WBC/hpf   Bacteria, UA RARE (A) NONE SEEN   Squamous Epithelial / LPF 0-5 0 - 5   Mucus PRESENT    CT Renal Stone Study  Result Date: 09/19/2022 CLINICAL DATA:  Right flank pain with vaginal pressure for 2 days EXAM: CT ABDOMEN AND PELVIS WITHOUT CONTRAST TECHNIQUE: Multidetector CT imaging of the abdomen and pelvis was performed following the standard protocol without IV contrast. RADIATION DOSE REDUCTION: This exam was performed according to the departmental dose-optimization program which includes automated exposure control, adjustment of the mA and/or kV according to patient size and/or use of iterative reconstruction technique. COMPARISON:  04/03/2004 FINDINGS: Lower chest:  No contributory findings. Hepatobiliary: No focal liver abnormality.No evidence of biliary obstruction or stone. Pancreas: Unremarkable. Spleen: Unremarkable. Adrenals/Urinary Tract: Negative adrenals. No hydronephrosis or stone. Unremarkable bladder. Stomach/Bowel:  No obstruction. No appendicitis. Vascular/Lymphatic: No acute vascular abnormality. No mass or adenopathy. Reproductive:Heterogeneous thickening at the level of the cervix with both low and high density appearance. On axial images hematocervix is suggested but on sagittal reformats the central higher density areas appeared elongated and may reflect the cervical cavity with adjacent low-density thickening. Mass, inflammation, and hemorrhage are not distinguished on this noncontrast study. Only mild adjacent fat stranding not suggestive of acute infection. No endometrial cavity fluid suggested. The  vaginal also appears collapsed. Other: Abdominal wall scarring with fatty left para median hernia. Musculoskeletal: No acute abnormalities. IMPRESSION: Marked thickening of the cervix, assessment limited without contrast. Recommend correlation with exam and pelvic ultrasound. No hydronephrosis or nephrolithiasis. Fatty ventral abdominal wall hernia. Electronically Signed   By: Tiburcio Pea M.D.   On: 09/19/2022 06:05     Radiology CT Renal Stone Study  Result Date: 09/19/2022 CLINICAL DATA:  Right flank pain with vaginal pressure for 2 days EXAM: CT ABDOMEN AND PELVIS WITHOUT CONTRAST TECHNIQUE: Multidetector CT imaging of the abdomen and pelvis was performed following the standard protocol without IV contrast. RADIATION DOSE REDUCTION: This exam was performed according to the departmental dose-optimization program which includes automated exposure control, adjustment of the mA and/or kV according to patient size and/or use of iterative reconstruction technique. COMPARISON:  04/03/2004 FINDINGS: Lower chest:  No contributory findings. Hepatobiliary: No focal liver abnormality.No evidence of biliary obstruction or stone. Pancreas: Unremarkable. Spleen: Unremarkable. Adrenals/Urinary Tract: Negative adrenals. No hydronephrosis or stone. Unremarkable bladder. Stomach/Bowel:  No obstruction. No appendicitis. Vascular/Lymphatic: No acute vascular abnormality. No mass or adenopathy. Reproductive:Heterogeneous thickening at the level of the cervix with both low and high density appearance. On axial images hematocervix is suggested but on sagittal reformats the central higher density areas appeared elongated and may reflect the cervical cavity with adjacent low-density thickening. Mass, inflammation, and hemorrhage are not distinguished on this noncontrast study. Only mild adjacent fat stranding not suggestive of acute infection. No endometrial cavity fluid suggested. The vaginal also appears collapsed. Other:  Abdominal wall scarring with fatty left para median hernia. Musculoskeletal: No acute abnormalities. IMPRESSION: Marked thickening of the cervix, assessment limited without contrast. Recommend correlation with exam and pelvic ultrasound. No hydronephrosis or nephrolithiasis. Fatty ventral abdominal wall hernia. Electronically Signed   By: Tiburcio Pea M.D.   On: 09/19/2022 06:05  Procedures Procedures    Medications Ordered in ED Medications  magnesium sulfate IVPB 2 g 50 mL (2 g Intravenous New Bag/Given 09/19/22 0635)  cephALEXin (KEFLEX) capsule 500 mg (has no administration in time range)  ketorolac (TORADOL) 30 MG/ML injection 30 mg (30 mg Intravenous Given 09/19/22 0634)  ondansetron (ZOFRAN) injection 4 mg (4 mg Intravenous Given 09/19/22 1962)    ED Course/ Medical Decision Making/ A&P                           Medical Decision Making Patient with UTI earlier this month who was treated presents with right low back pain and pressure in lower abdomen and pelvis   Problems Addressed: Enlarged cervix:    Details: Needs close follow up with GYN, will need a pap smear.  Uterus was also enlarged with need additional testing by GYN. Referral to GYN given.  Patient verbalizes understanding of need to follow up  Pelvic pain:    Details: Needs close follow up with GYN, given symptoms will treat for UTI with Keflex Urinary tract infection with hematuria, site unspecified: acute illness or injury    Details: Keflex for UTI, pyridium  Vaginal bleeding:    Details: Has a h/o of severe menstrual bleeding.  Will need close GYN follow up   Amount and/or Complexity of Data Reviewed External Data Reviewed: notes.    Details: Previous notes reviewed  Labs: ordered.    Details: All labs reviewed: mild UTI with hemoglobin on normal white count 8, normal hemoglobin 12.1, normal platelet count.  Sodium 134, potassium slightly low 3.1, normal creatinine .3 Radiology: ordered.    Details:  Constipation and enlarged uterus and thickened bladder by me on CT  Risk Prescription drug management. Risk Details: Patient with pelvic pain.  No kidney stones.  CT finding with abnormal cervix.  Cervix is enlarged on exam.  I have discussed this with the patient and need for close follow up.  Will need pap smear and ongoing care. Will start antibiotics for UTI.  Patient informed of all CT results.  Pain medication, antiemetics and antibiotics sent to pharmacy along with pyridium. Work note provided.  Strict return precautions.      Final Clinical Impression(s) / ED Diagnoses Final diagnoses:  Enlarged cervix  Pelvic pain  Urinary tract infection with hematuria, site unspecified  Vaginal bleeding  Return for intractable cough, coughing up blood, fevers > 100.4 unrelieved by medication, shortness of breath, intractable vomiting, chest pain, shortness of breath, weakness, numbness, changes in speech, facial asymmetry, abdominal pain, passing out, Inability to tolerate liquids or food, cough, altered mental status or any concerns. No signs of systemic illness or infection. The patient is nontoxic-appearing on exam and vital signs are within normal limits.  I have reviewed the triage vital signs and the nursing notes. Pertinent labs & imaging results that were available during my care of the patient were reviewed by me and considered in my medical decision making (see chart for details). After history, exam, and medical workup I feel the patient has been appropriately medically screened and is safe for discharge home. Pertinent diagnoses were discussed with the patient. Patient was given return precautions.   Rx / DC Orders ED Discharge Orders          Ordered    cephALEXin (KEFLEX) 500 MG capsule  4 times daily        09/19/22 0642    meloxicam (MOBIC) 15 MG  tablet  Daily        09/19/22 0642    ondansetron (ZOFRAN-ODT) 8 MG disintegrating tablet        09/19/22 0642    phenazopyridine  (PYRIDIUM) 200 MG tablet  3 times daily PRN        09/19/22 0643              Hanna Aultman, MD 09/19/22 0715

## 2022-09-19 NOTE — ED Triage Notes (Signed)
Pt c/o right flank pain and vaginal pressure since yesterday.

## 2022-10-22 ENCOUNTER — Other Ambulatory Visit: Payer: Self-pay

## 2022-10-22 ENCOUNTER — Encounter (HOSPITAL_BASED_OUTPATIENT_CLINIC_OR_DEPARTMENT_OTHER): Payer: Self-pay | Admitting: Obstetrics and Gynecology

## 2022-10-22 DIAGNOSIS — Z01818 Encounter for other preprocedural examination: Secondary | ICD-10-CM | POA: Diagnosis present

## 2022-10-22 DIAGNOSIS — R102 Pelvic and perineal pain: Secondary | ICD-10-CM | POA: Diagnosis not present

## 2022-10-22 NOTE — Progress Notes (Signed)
Your procedure is scheduled on Tuesday, 10/29/2022.  Report to [facility] [facility] AT  5:30 A. M.   Call this number if you have problems the morning of surgery  :[phone].   OUR ADDRESS IS [address].  WE ARE LOCATED IN [facility].  PLEASE BRING YOUR INSURANCE CARD AND PHOTO ID DAY OF SURGERY.  ONLY 2 PEOPLE ARE ALLOWED IN  WAITING  ROOM.                                      REMEMBER:  DO NOT EAT FOOD, CANDY GUM OR MINTS  AFTER MIDNIGHT THE NIGHT BEFORE YOUR SURGERY . YOU MAY HAVE CLEAR LIQUIDS FROM MIDNIGHT THE NIGHT BEFORE YOUR SURGERY UNTIL  4:30 AM. NO CLEAR LIQUIDS AFTER   4:30 AM DAY OF SURGERY.  YOU MAY  BRUSH YOUR TEETH MORNING OF SURGERY AND RINSE YOUR MOUTH OUT, NO CHEWING GUM CANDY OR MINTS.     CLEAR LIQUID DIET   Foods Allowed                                                                     Foods Excluded  Coffee and tea, regular and decaf                             liquids that you cannot  Plain Jell-O                                                                   see through such as: Fruit ices (not with fruit pulp)                                     milk, soups, orange juice  Plain  Popsicles                                    All solid food Carbonated beverages, regular and diet                                    Cranberry, grape and apple juices Sports drinks like Gatorade _____________________________________________________________________     TAKE ONLY THESE MEDICATIONS MORNING OF SURGERY: NONE   UP TO 4 VISITORS  MAY VISIT IN THE EXTENDED RECOVERY ROOM UNTIL 800 PM ONLY.  ONE  VISITOR AGE 60 AND OVER MAY SPEND THE NIGHT AND MUST BE IN EXTENDED RECOVERY ROOM NO LATER THAN 800 PM . YOUR DISCHARGE TIME AFTER YOU SPEND THE NIGHT IS 900 AM THE MORNING AFTER YOUR SURGERY.  YOU MAY PACK A SMALL OVERNIGHT BAG WITH TOILETRIES FOR YOUR OVERNIGHT STAY IF  YOU WISH.  YOUR PRESCRIPTION MEDICATIONS WILL BE PROVIDED  DURING [place].                                      DO NOT WEAR JEWERLY, MAKE UP. DO NOT WEAR LOTIONS, POWDERS, PERFUMES OR NAIL POLISH ON YOUR FINGERNAILS. TOENAIL POLISH IS OK TO WEAR. DO NOT SHAVE FOR 48 HOURS PRIOR TO DAY OF SURGERY. MEN MAY SHAVE FACE AND NECK. CONTACTS, GLASSES, OR DENTURES MAY NOT BE WORN TO SURGERY.  REMEMBER: NO SMOKING, DRUGS OR ALCOHOL FOR 24 HOURS BEFORE YOUR SURGERY.                                    [facility] IS NOT RESPONSIBLE  FOR ANY BELONGINGS.                                                                    Marland Kitchen           [place] - Preparing for Surgery Before surgery, you can play an important role.  Because skin is not sterile, your skin needs to be as free of germs as possible.  You can reduce the number of germs on your skin by washing with CHG (chlorahexidine gluconate) soap before surgery.  CHG is an antiseptic cleaner which kills germs and bonds with the skin to continue killing germs even after washing. Please DO NOT use if you have an allergy to CHG or antibacterial soaps.  If your skin becomes reddened/irritated stop using the CHG and inform your nurse when you arrive at Short Stay. Do not shave (including legs and underarms) for at least 48 hours prior to the first CHG shower.  You may shave your face/neck. Please follow these instructions carefully:  1.  Shower with CHG Soap the night before surgery and the  morning of Surgery.  2.  If you choose to wash your hair, wash your hair first as usual with your  normal  shampoo.  3.  After you shampoo, rinse your hair and body thoroughly to remove the  shampoo.                                        4.  Use CHG as you would any other liquid soap.  You can apply chg directly  to the skin and wash , chg soap provided, night before and morning of your surgery.  5.  Apply the CHG Soap to your body ONLY FROM THE NECK DOWN.   Do not use on face/ open                           Wound or open  sores. Avoid contact with eyes, ears mouth and genitals (private parts).                       Wash face,  Genitals (private parts) with your normal soap.  6.  Wash thoroughly, paying special attention to the area where your surgery  will be performed.  7.  Thoroughly rinse your body with warm water from the neck down.  8.  DO NOT shower/wash with your normal soap after using and rinsing off  the CHG Soap.             9.  Pat yourself dry with a clean towel.            10.  Wear clean pajamas.            11.  Place clean sheets on your bed the night of your first shower and do not  sleep with pets. Day of Surgery : Do not apply any lotions/deodorants the morning of surgery.  Please wear clean clothes to the hospital/surgery center.  IF YOU HAVE ANY SKIN IRRITATION OR PROBLEMS WITH THE SURGICAL SOAP, PLEASE GET A BAR OF GOLD DIAL SOAP AND SHOWER THE NIGHT BEFORE YOUR SURGERY AND THE MORNING OF YOUR SURGERY. PLEASE LET THE NURSE KNOW MORNING OF YOUR SURGERY IF YOU HAD ANY PROBLEMS WITH THE SURGICAL SOAP.   ________________________________________________________________________                                                        QUESTIONS [facility] PRE OP NURSE PHONE [phone].

## 2022-10-22 NOTE — Progress Notes (Signed)
Spoke w/ via phone for pre-op interview---[facility] Lab needs dos---none-               Lab results------10/24/22 lab appt for cbc, type & screen COVID test -----patient states asymptomatic no test needed Arrive at -------0530 on Tuesday, 10/29/2022 NPO after MN NO Solid Food.  Clear liquids from MN until---0430 Med rec completed Medications to take morning of surgery -----NONE Diabetic medication -----n/a Patient instructed no nail polish to be worn day of surgery Patient instructed to bring photo id and insurance card day of surgery Patient aware to have Driver (ride ) / caregiver    for 24 hours after surgery - husband, Barbara Cower Patient Special Instructions -----Extended / overnight stay instructions given. Pre-Op special Istructions -----none Patient verbalized understanding of instructions that were given at this phone interview. Patient denies shortness of breath, chest pain, fever, cough at this phone interview.

## 2022-10-24 ENCOUNTER — Encounter (HOSPITAL_COMMUNITY)
Admission: RE | Admit: 2022-10-24 | Discharge: 2022-10-24 | Disposition: A | Discharge status: Home / Self Care | Payer: 59 | Source: Ambulatory Visit | Attending: Obstetrics and Gynecology | Admitting: Obstetrics and Gynecology

## 2022-10-24 DIAGNOSIS — R102 Pelvic and perineal pain: Secondary | ICD-10-CM | POA: Diagnosis not present

## 2022-10-24 DIAGNOSIS — Z01818 Encounter for other preprocedural examination: Secondary | ICD-10-CM | POA: Diagnosis not present

## 2022-10-24 LAB — CBC
HCT: 36 % (ref 36.0–46.0)
Hemoglobin: 11.1 g/dL — ABNORMAL LOW (ref 12.0–15.0)
MCH: 25.7 pg — ABNORMAL LOW (ref 26.0–34.0)
MCHC: 30.8 g/dL (ref 30.0–36.0)
MCV: 83.3 fL (ref 80.0–100.0)
Platelets: 166 10*3/uL (ref 150–400)
RBC: 4.32 MIL/uL (ref 3.87–5.11)
RDW: 13.9 % (ref 11.5–15.5)
WBC: 6.3 10*3/uL (ref 4.0–10.5)
nRBC: 0 % (ref 0.0–0.2)

## 2022-10-24 NOTE — Progress Notes (Addendum)
Patient came in for pre-op lab appt today. Her BP was elevated at 164/102. Patient states she was nervous about surgery and stressed over trying to finish Christmas shopping for her five kids. An EKG was ordered. Patient was instructed to let her primary care physician know about her elevated BP today. I called and left a message with [facility] @ Dr. Jaynee Eagles office and let them know about her elevated BP and that she had been instructed to follow-up with her primary care physician.  Carmen Simmons returned my call. She stated that she is sending Dr. Lorane Gell a message and letting him know.

## 2022-10-26 DIAGNOSIS — I10 Essential (primary) hypertension: Secondary | ICD-10-CM | POA: Insufficient documentation

## 2022-10-29 ENCOUNTER — Ambulatory Visit (HOSPITAL_BASED_OUTPATIENT_CLINIC_OR_DEPARTMENT_OTHER): Admission: RE | Admit: 2022-10-29 | Payer: 59 | Source: Ambulatory Visit | Admitting: Obstetrics and Gynecology

## 2022-10-29 DIAGNOSIS — R102 Pelvic and perineal pain: Secondary | ICD-10-CM

## 2022-10-29 DIAGNOSIS — Z01818 Encounter for other preprocedural examination: Secondary | ICD-10-CM

## 2022-10-29 HISTORY — DX: Cardiac arrhythmia, unspecified: I49.9

## 2022-10-29 HISTORY — DX: Presence of spectacles and contact lenses: Z97.3

## 2022-10-29 HISTORY — DX: Other complications of anesthesia, initial encounter: T88.59XA

## 2022-10-29 HISTORY — DX: Unspecified osteoarthritis, unspecified site: M19.90

## 2022-10-29 HISTORY — DX: Pneumonia, unspecified organism: J18.9

## 2022-10-29 LAB — TYPE AND SCREEN
ABO/RH(D): A POS
Antibody Screen: NEGATIVE

## 2022-10-29 SURGERY — HYSTERECTOMY, TOTAL, ABDOMINAL, WITH SALPINGECTOMY
Anesthesia: General | Laterality: Bilateral

## 2022-11-21 ENCOUNTER — Ambulatory Visit: Payer: 59 | Admitting: Family Medicine

## 2022-11-21 VITALS — BP 131/91 | HR 80 | Temp 98.0°F | Resp 16 | Ht 70.0 in | Wt 248.0 lb

## 2022-11-21 DIAGNOSIS — R519 Headache, unspecified: Secondary | ICD-10-CM

## 2022-11-21 DIAGNOSIS — Z7689 Persons encountering health services in other specified circumstances: Secondary | ICD-10-CM | POA: Diagnosis not present

## 2022-11-21 DIAGNOSIS — Z23 Encounter for immunization: Secondary | ICD-10-CM | POA: Diagnosis not present

## 2022-11-21 DIAGNOSIS — I10 Essential (primary) hypertension: Secondary | ICD-10-CM

## 2022-11-21 DIAGNOSIS — N888 Other specified noninflammatory disorders of cervix uteri: Secondary | ICD-10-CM | POA: Insufficient documentation

## 2022-11-21 DIAGNOSIS — N83209 Unspecified ovarian cyst, unspecified side: Secondary | ICD-10-CM | POA: Insufficient documentation

## 2022-11-21 MED ORDER — LOSARTAN POTASSIUM 50 MG PO TABS: 50.0000 mg | ORAL_TABLET | Freq: Every day | ORAL | 0 refills | Status: DC

## 2022-11-21 MED ORDER — SUMATRIPTAN SUCCINATE 100 MG PO TABS: 100.0000 mg | ORAL_TABLET | ORAL | 1 refills | Status: DC | PRN

## 2022-11-21 NOTE — Progress Notes (Signed)
New Patient Office Visit  Subjective    Patient ID: Carmen Simmons, female    DOB: November 12, 1975  Age: 47 y.o. MRN: [id number]  CC:  Chief Complaint  Patient presents with   Establish Care    HPI Carmen Simmons presents to establish care and for concern regarding elevated blood pressure readings. Patient also reports intermittent headaches.    Outpatient Encounter Medications as of 11/21/2022  Medication Sig   losartan (COZAAR) 50 MG tablet Take 1 tablet (50 mg total) by mouth daily.   SUMAtriptan (IMITREX) 100 MG tablet Take 1 tablet (100 mg total) by mouth every 2 (two) hours as needed for migraine. May repeat in 2 hours if headache persists or recurs.   EPINEPHrine (EPIPEN 2-PAK) 0.3 mg/0.3 mL IJ SOAJ injection    [DISCONTINUED] cephALEXin (KEFLEX) 500 MG capsule Take 1 capsule (500 mg total) by mouth 4 (four) times daily. (Patient not taking: Reported on 10/22/2022)   [DISCONTINUED] ibuprofen (ADVIL,MOTRIN) 600 MG tablet 1 po  pc every 6 hours with food x 3 days then as needed for pain (Patient not taking: Reported on 11/21/2022)   [DISCONTINUED] Multiple Vitamin (MULTIVITAMIN) tablet Take 1 tablet by mouth daily.   (Patient not taking: Reported on 11/21/2022)   No facility-administered encounter medications on file as of 11/21/2022.    Past Medical History:  Diagnosis Date   Allergy    seasonal allergies   Anemia    with pregnancy   Arrhythmia    remote hx of supraventricular tachycardia about 20 years ago as of 10/22/22   Arthritis    right knee   Chronic headache    stress related - otc meds prn   Complication of anesthesia    Patient states that it takes her a little longer to wake up.   Eczema    Pneumonia    about 2003   Wears glasses     Past Surgical History:  Procedure Laterality Date   ABDOMINOPLASTY  2022   CESAREAN SECTION  2011   x 1 - twins   ganglion cyst removal Right 2018   knee   IUD REMOVAL N/A 08/09/2015   Procedure: INTRAUTERINE DEVICE (IUD)  REMOVAL;  Surgeon: Crawford Givens, MD;  Location: [facility];  Service: Gynecology;  Laterality: N/A;   LAPAROSCOPIC BILATERAL SALPINGECTOMY Bilateral 08/09/2015   Procedure: LAPAROSCOPIC BILATERAL SALPINGECTOMY;  Surgeon: Crawford Givens, MD;  Location: [facility];  Service: Gynecology;  Laterality: Bilateral;   WISDOM TOOTH EXTRACTION  2002    Family History  Problem Relation Age of Onset   Hypertension Mother     Social History   Socioeconomic History   Marital status: Married    Spouse name: Not on file   Number of children: Not on file   Years of education: Not on file   Highest education level: Not on file  Occupational History   Not on file  Tobacco Use   Smoking status: Never   Smokeless tobacco: Never  Vaping Use   Vaping Use: Never used  Substance and Sexual Activity   Alcohol use: Not Currently   Drug use: No   Sexual activity: Not on file  Other Topics Concern   Not on file  Social History Narrative   Not on file   Social Determinants of Health   Financial Resource Strain: Not on file  Food Insecurity: Not on file  Transportation Needs: Not on file  Physical Activity: Not on file  Stress: Not on file  Social  Connections: Not on file  Intimate Partner Violence: Not on file    Review of Systems  All other systems reviewed and are negative.       Objective    BP (!) 131/91   Pulse 80   Temp 98 F (36.7 C) (Oral)   Resp 16   Ht 5\' 10"  (1.778 m)   Wt 248 lb (112.5 kg)   LMP 09/11/2022 (Exact Date) Comment: irregular  SpO2 95%   BMI 35.58 kg/m   Physical Exam Vitals and nursing note reviewed.  Constitutional:      General: She is not in acute distress.    Appearance: She is obese.  HENT:     Head: Normocephalic and atraumatic.  Cardiovascular:     Rate and Rhythm: Normal rate and regular rhythm.  Pulmonary:     Effort: Pulmonary effort is normal.     Breath sounds: Normal breath sounds.  Abdominal:     Palpations: Abdomen is soft.      Tenderness: There is no abdominal tenderness.  Musculoskeletal:     Cervical back: Normal range of motion and neck supple.  Neurological:     General: No focal deficit present.     Mental Status: She is alert and oriented to person, place, and time.  Psychiatric:        Mood and Affect: Mood normal.        Behavior: Behavior normal.         Assessment & Plan:   1. Essential hypertension Losartan 50 mg prescribed. monitor  2. Nonintractable headache, unspecified chronicity pattern, unspecified headache type Imitrex prescribed. Patient to keep sx diary.   3. Need for immunization against influenza  - Flu Vaccine QUAD 62mo+IM (Fluarix, Fluzone & Alfiuria Quad PF)  4. Encounter to establish care   Return in about 4 weeks (around 12/19/2022).   Becky Sax, MD

## 2022-11-21 NOTE — Progress Notes (Signed)
Patient is here to established care with provider today. Patient has many health concern they would like to discuss with provider today  Care gaps discuss at appointment today  

## 2022-11-25 ENCOUNTER — Encounter: Payer: Self-pay | Admitting: Family Medicine

## 2022-12-05 ENCOUNTER — Telehealth: Payer: Self-pay | Admitting: Family Medicine

## 2022-12-05 NOTE — Telephone Encounter (Signed)
Carmen Simmons has been called and inform that form was faxed already. Left message on VM

## 2022-12-05 NOTE — Telephone Encounter (Signed)
Copied from [place] [phone]. Topic: General - Inquiry >> Dec 02, 2022 12:37 PM Marcellus Scott wrote: Reason for CRM: Stanton Kidney from pt's O[facility]. Stanton Kidney is calling to follow up on the surgical clearance request form.  Stanton Kidney stated they received office notes from 01/18 but no surgical clearance form. Mentioned they would resend today; however, they have faxed on the 21 and the 22.  Please advise.

## 2022-12-31 ENCOUNTER — Other Ambulatory Visit: Payer: Self-pay

## 2022-12-31 ENCOUNTER — Encounter (HOSPITAL_BASED_OUTPATIENT_CLINIC_OR_DEPARTMENT_OTHER): Payer: Self-pay | Admitting: Obstetrics and Gynecology

## 2022-12-31 NOTE — Progress Notes (Signed)
Spoke w/ via phone for pre-op interview---Carmen Simmons needs dos---- urine pregnancy             Simmons results------01/10/23 Simmons appt for cbc, bmp, type & screen, hiv, 10/24/22 EKG in chart & Epic COVID test -----patient states asymptomatic no test needed Arrive at -------0530 on Monday, 01/13/23 NPO after MN NO Solid Food.  Clear liquids from MN until---0430 Med rec completed Medications to take morning of surgery -----Imitrex prn, Benadryl prn Diabetic medication -----n/a Patient instructed no nail polish to be worn day of surgery Patient instructed to bring photo id and insurance card day of surgery Patient aware to have Driver (ride ) / caregiver    for 24 hours after surgery - husband, Carmen Simmons Patient Special Instructions -----Extended / overnight stay instructions given. Pre-Op special Istructions -----none Patient verbalized understanding of instructions that were given at this phone interview. Patient denies shortness of breath, chest pain, fever, cough at this phone interview.

## 2022-12-31 NOTE — Progress Notes (Signed)
Your procedure is scheduled on Monday, 01/13/2023.  Report to [facility] AT  5:30 AM.   Call this number if you have problems the morning of surgery  [phone].   OUR ADDRESS IS [address].  WE ARE LOCATED IN [facility].  PLEASE BRING YOUR INSURANCE CARD AND PHOTO ID DAY OF SURGERY.  ONLY 2 PEOPLE ARE ALLOWED IN  WAITING  ROOM / CURRENTLY NO ONE UNDER AGE 46                                     REMEMBER:  DO NOT EAT FOOD, CANDY GUM OR MINTS  AFTER MIDNIGHT THE NIGHT BEFORE YOUR SURGERY . YOU MAY HAVE CLEAR LIQUIDS FROM MIDNIGHT THE NIGHT BEFORE YOUR SURGERY UNTIL  4:30 AM. NO CLEAR LIQUIDS AFTER   4:30 AM DAY OF SURGERY.  YOU MAY  BRUSH YOUR TEETH MORNING OF SURGERY AND RINSE YOUR MOUTH OUT, NO CHEWING GUM CANDY OR MINTS.     CLEAR LIQUID DIET    Allowed      Water                                                                   Coffee and tea, regular and decaf  (NO cream or milk products of any type, may sweeten)                         Carbonated beverages, regular and diet                                    Sports drinks like Gatorade _____________________________________________________________________     TAKE ONLY THESE MEDICATIONS MORNING OF SURGERY: Imitrex if needed, Benadryl if needed Do NOT take Losartan (Cozaar) on the morning of surgery.    UP TO 4 VISITORS  MAY VISIT IN THE EXTENDED RECOVERY ROOM UNTIL 800 PM ONLY.  ONE  VISITOR AGE 11 AND OVER MAY SPEND THE NIGHT AND MUST BE IN EXTENDED RECOVERY ROOM NO LATER THAN 800 PM . YOUR DISCHARGE TIME AFTER YOU SPEND THE NIGHT IS 900 AM THE MORNING AFTER YOUR SURGERY.  YOU MAY PACK A SMALL OVERNIGHT BAG WITH TOILETRIES FOR YOUR OVERNIGHT STAY IF YOU WISH.  YOUR PRESCRIPTION MEDICATIONS WILL BE PROVIDED DURING [facility].                                      DO NOT WEAR JEWERLY/  METAL/  PIERCINGS (INCLUDING NO PLASTIC PIERCINGS) DO NOT WEAR LOTIONS,  POWDERS, PERFUMES OR NAIL POLISH ON YOUR FINGERNAILS. TOENAIL POLISH IS OK TO WEAR. DO NOT SHAVE FOR 48 HOURS PRIOR TO DAY OF SURGERY.  CONTACTS, GLASSES, OR DENTURES MAY NOT BE WORN TO SURGERY.  REMEMBER: NO SMOKING, VAPING ,  DRUGS OR ALCOHOL FOR 24 HOURS BEFORE YOUR SURGERY.  [facility] IS NOT RESPONSIBLE  FOR ANY BELONGINGS.                                                                    Marland Kitchen           [facility] - Preparing for Surgery Before surgery, you can play an important role.  Because skin is not sterile, your skin needs to be as free of germs as possible.  You can reduce the number of germs on your skin by washing with CHG (chlorahexidine gluconate) soap before surgery.  CHG is an antiseptic cleaner which kills germs and bonds with the skin to continue killing germs even after washing. Please DO NOT use if you have an allergy to CHG or antibacterial soaps.  If your skin becomes reddened/irritated stop using the CHG and inform your nurse when you arrive at Short Stay. Do not shave (including legs and underarms) for at least 48 hours prior to the first CHG shower.  You may shave your face/neck. Please follow these instructions carefully:  1.  Shower with CHG Soap the night before surgery and the  morning of Surgery.  2.  If you choose to wash your hair, wash your hair first as usual with your  normal  shampoo.  3.  After you shampoo, rinse your hair and body thoroughly to remove the  shampoo.                                        4.  Use CHG as you would any other liquid soap.  You can apply chg directly  to the skin and wash , chg soap provided, night before and morning of your surgery.  5.  Apply the CHG Soap to your body ONLY FROM THE NECK DOWN.   Do not use on face/ open                           Wound or open sores. Avoid contact with eyes, ears mouth and genitals (private parts).                       Wash face,  Genitals (private parts) with  your normal soap.             6.  Wash thoroughly, paying special attention to the area where your surgery  will be performed.  7.  Thoroughly rinse your body with warm water from the neck down.  8.  DO NOT shower/wash with your normal soap after using and rinsing off  the CHG Soap.             9.  Pat yourself dry with a clean towel.            10.  Wear clean pajamas.            11.  Place clean sheets on your bed the night of your first shower and do not  sleep with pets. Day of Surgery : Do not apply any lotions/ powders the morning of surgery.  Please wear clean clothes to the hospital/surgery  center.  IF YOU HAVE ANY SKIN IRRITATION OR PROBLEMS WITH THE SURGICAL SOAP, PLEASE GET A BAR OF GOLD DIAL SOAP AND SHOWER THE NIGHT BEFORE YOUR SURGERY AND THE MORNING OF YOUR SURGERY. PLEASE LET THE NURSE KNOW MORNING OF YOUR SURGERY IF YOU HAD ANY PROBLEMS WITH THE SURGICAL SOAP.   ________________________________________________________________________                                                        QUESTIONS [facility] PRE OP NURSE PHONE [phone].

## 2023-01-02 ENCOUNTER — Ambulatory Visit: Payer: 59 | Admitting: Family Medicine

## 2023-01-02 VITALS — BP 135/88 | HR 86 | Temp 97.6°F | Resp 16 | Wt 249.6 lb

## 2023-01-02 DIAGNOSIS — I10 Essential (primary) hypertension: Secondary | ICD-10-CM

## 2023-01-02 MED ORDER — HYDROCHLOROTHIAZIDE 12.5 MG PO TABS: 12.5000 mg | ORAL_TABLET | Freq: Every day | ORAL | 0 refills | Status: DC

## 2023-01-03 ENCOUNTER — Encounter: Payer: Self-pay | Admitting: Family Medicine

## 2023-01-03 NOTE — Progress Notes (Signed)
Established Patient Office Visit  Subjective    Patient ID: Carmen Simmons, female    DOB: May 02, 1976  Age: 47 y.o. MRN: [id number]  CC:  Chief Complaint  Patient presents with   Follow-up   Hypertension    HPI Carmen Simmons presents for routine follow up of hypertension. Patient denies acute complaints or concerns.    Outpatient Encounter Medications as of 01/02/2023  Medication Sig   cetirizine (ZYRTEC) 10 MG tablet Take by mouth.   diphenhydrAMINE (BENADRYL) 50 MG capsule Take 50 mg by mouth every 6 (six) hours as needed. For environmental allergies.   EPINEPHrine (EPIPEN 2-PAK) 0.3 mg/0.3 mL IJ SOAJ injection    fluticasone (FLONASE) 50 MCG/ACT nasal spray 1 spray as needed for rhinitis.   hydrochlorothiazide (HYDRODIURIL) 12.5 MG tablet Take 1 tablet (12.5 mg total) by mouth daily.   losartan (COZAAR) 50 MG tablet Take 1 tablet (50 mg total) by mouth daily.   SUMAtriptan (IMITREX) 100 MG tablet Take 1 tablet (100 mg total) by mouth every 2 (two) hours as needed for migraine. May repeat in 2 hours if headache persists or recurs.   No facility-administered encounter medications on file as of 01/02/2023.    Past Medical History:  Diagnosis Date   Allergy    seasonal allergies   Anemia    with pregnancy   Arrhythmia    remote hx of supraventricular tachycardia about 20 years ago as of 10/22/22   Arthritis    right knee   Chronic headache    stress related - otc meds prn   Complication of anesthesia    Patient states that it takes her a little longer to wake up.   Eczema    Hypertension    Follows w/ PCP, Dr. Dorna Mai @ [facility].   Pneumonia    about 2003   Wears glasses     Past Surgical History:  Procedure Laterality Date   ABDOMINOPLASTY  2022   CESAREAN SECTION  2011   x 1 - twins   ganglion cyst removal Right 2018   knee   IUD REMOVAL N/A 08/09/2015   Procedure: INTRAUTERINE DEVICE (IUD) REMOVAL;  Surgeon: Crawford Givens, MD;   Location: [facility];  Service: Gynecology;  Laterality: N/A;   LAPAROSCOPIC BILATERAL SALPINGECTOMY Bilateral 08/09/2015   Procedure: LAPAROSCOPIC BILATERAL SALPINGECTOMY;  Surgeon: Crawford Givens, MD;  Location: [facility];  Service: Gynecology;  Laterality: Bilateral;   WISDOM TOOTH EXTRACTION  2002    Family History  Problem Relation Age of Onset   Hypertension Mother     Social History   Socioeconomic History   Marital status: Married    Spouse name: Not on file   Number of children: Not on file   Years of education: Not on file   Highest education level: Not on file  Occupational History   Not on file  Tobacco Use   Smoking status: Never   Smokeless tobacco: Never  Vaping Use   Vaping Use: Never used  Substance and Sexual Activity   Alcohol use: Not Currently   Drug use: No   Sexual activity: Not on file  Other Topics Concern   Not on file  Social History Narrative   Not on file   Social Determinants of Health   Financial Resource Strain: Not on file  Food Insecurity: Not on file  Transportation Needs: Not on file  Physical Activity: Not on file  Stress: Not on file  Social Connections: Not  on file  Intimate Partner Violence: Not on file    Review of Systems  All other systems reviewed and are negative.       Objective    BP 135/88   Pulse 86   Temp 97.6 F (36.4 C) (Oral)   Resp 16   Wt 249 lb 9 oz (113.2 kg)   SpO2 95%   BMI 35.81 kg/m   Physical Exam Vitals and nursing note reviewed.  Constitutional:      General: She is not in acute distress. Cardiovascular:     Rate and Rhythm: Normal rate and regular rhythm.  Pulmonary:     Effort: Pulmonary effort is normal.     Breath sounds: Normal breath sounds.  Abdominal:     Palpations: Abdomen is soft.     Tenderness: There is no abdominal tenderness.  Neurological:     General: No focal deficit present.     Mental Status: She is alert and oriented to person, place, and time.          Assessment & Plan:   1. Essential hypertension Borderline readings with present management. Will add HCTZ 12.5 mg to regimen and monitor    No follow-ups on file.   Carmen Sax, MD

## 2023-01-10 ENCOUNTER — Encounter (HOSPITAL_COMMUNITY)
Admission: RE | Admit: 2023-01-10 | Discharge: 2023-01-10 | Disposition: A | Discharge status: Home / Self Care | Payer: 59 | Source: Ambulatory Visit | Attending: Obstetrics and Gynecology | Admitting: Obstetrics and Gynecology

## 2023-01-10 DIAGNOSIS — Z01812 Encounter for preprocedural laboratory examination: Secondary | ICD-10-CM | POA: Insufficient documentation

## 2023-01-10 DIAGNOSIS — N939 Abnormal uterine and vaginal bleeding, unspecified: Secondary | ICD-10-CM | POA: Diagnosis not present

## 2023-01-10 DIAGNOSIS — D259 Leiomyoma of uterus, unspecified: Secondary | ICD-10-CM | POA: Diagnosis not present

## 2023-01-10 LAB — CBC
HCT: 37.7 % (ref 36.0–46.0)
Hemoglobin: 11.8 g/dL — ABNORMAL LOW (ref 12.0–15.0)
MCH: 25.7 pg — ABNORMAL LOW (ref 26.0–34.0)
MCHC: 31.3 g/dL (ref 30.0–36.0)
MCV: 82.1 fL (ref 80.0–100.0)
Platelets: 159 10*3/uL (ref 150–400)
RBC: 4.59 MIL/uL (ref 3.87–5.11)
RDW: 16.8 % — ABNORMAL HIGH (ref 11.5–15.5)
WBC: 6.3 10*3/uL (ref 4.0–10.5)
nRBC: 0 % (ref 0.0–0.2)

## 2023-01-10 LAB — BASIC METABOLIC PANEL
Anion gap: 8 (ref 5–15)
BUN: 13 mg/dL (ref 6–20)
CO2: 24 mmol/L (ref 22–32)
Calcium: 8.8 mg/dL — ABNORMAL LOW (ref 8.9–10.3)
Chloride: 102 mmol/L (ref 98–111)
Creatinine, Ser: 0.55 mg/dL (ref 0.44–1.00)
GFR, Estimated: >60 mL/min (ref >60)
Glucose, Bld: 111 mg/dL — ABNORMAL HIGH (ref 70–99)
Potassium: 3.4 mmol/L — ABNORMAL LOW (ref 3.5–5.1)
Sodium: 134 mmol/L — ABNORMAL LOW (ref 135–145)

## 2023-01-10 LAB — HIV ANTIBODY (ROUTINE TESTING W REFLEX): HIV Screen 4th Generation wRfx: NONREACTIVE

## 2023-01-10 NOTE — H&P (Signed)
Gynecology History and Physical Preadmission H&P for scheduled procedure.  Carmen Simmons is a 47 y.o. female [phone] presenting for scheduled TAH BS for abnormal uterine bleeding, uterine fibroids, possible adenomyosis.  She has previously tried multiple hormonal medications for AUB including IUD as well as non-hormonal lysteda. This has been minimally helpful in improving her bleeding.  Of late, she has had increased cyclic pelvic pain.  She underwent CT scan last fall that was notable for enlarged cervix and was referred to our office.  On exam, cervix was normal appearing, pap was WNL.  Pelvic US revealed mulitple nabothian cysts (largest of which 2.1 cm), as well as multiple uterine fibroids (largest 2.8 cm) and appearance consistent with adenomyosis. EMS was 3 mm..   She also reports bulk symptoms and mucous discharge. She has a history of prior C section, bilateral tubal ligation, as well as abdominoplasty (2022).  This procedure was previously scheduled but delayed due to new onset hypertension, which has been followed by primary care provider and begun on medications.     OB History   No obstetric history on file.    Past Medical History:  Diagnosis Date   Allergy    seasonal allergies   Anemia    with pregnancy   Arrhythmia    remote hx of supraventricular tachycardia about 20 years ago as of 10/22/22   Arthritis    right knee   Chronic headache    stress related - otc meds prn   Complication of anesthesia    Patient states that it takes her a little longer to wake up.   Eczema    Hypertension    Follows w/ PCP, Dr. Dorna Mai @ [facility].   Pneumonia    about 2003   Wears glasses    Past Surgical History:  Procedure Laterality Date   ABDOMINOPLASTY  2022   CESAREAN SECTION  2011   x 1 - twins   ganglion cyst removal Right 2018   knee   IUD REMOVAL N/A 08/09/2015   Procedure: INTRAUTERINE DEVICE (IUD) REMOVAL;  Surgeon: Crawford Givens, MD;   Location: [facility];  Service: Gynecology;  Laterality: N/A;   LAPAROSCOPIC BILATERAL SALPINGECTOMY Bilateral 08/09/2015   Procedure: LAPAROSCOPIC BILATERAL SALPINGECTOMY;  Surgeon: Crawford Givens, MD;  Location: [facility];  Service: Gynecology;  Laterality: Bilateral;   WISDOM TOOTH EXTRACTION  2002   Family History: family history includes Hypertension in her mother. Social History:  reports that she has never smoked. She has never used smokeless tobacco. She reports that she does not currently use alcohol. She reports that she does not use drugs.   Review of Systems - Patient denies fever, chills, SOB, CP, N/V/D.  History   Height '5\' 10"'$  (1.778 m), weight 110.2 kg, last menstrual period 12/25/2022. Exam Physical Exam   Gen: alert, well appearing, no distress Chest: nonlabored breathing CV: no peripheral edema Abdomen: soft, nontender. Abdominoplasty scar. Ext: no evidence of DVT    Assessment/Plan: Admit for planned procedure. HTN - improved control with medications Discussed her symptoms are likely the result of a constellation of her findings of multiple uterine fibroids, enlarged cervix, adenomyosis, and perhaps scar from prior surgery. Has tried many hormonal methods without relief, and I do not suspect this would alleviate her bulk symptoms and pain. She would like to pursue definitive therapy with hysterectomy.  Discussed mode of hysterectomy recommendation for abdominal approach due to size of uterus, multiple fibroids, scar from C section and tubal  ligation, limits of pneumoperitoneum following panniculectomy, and possibility of distorted anatomy secondary to enlarged cervix. Discussed risks of surgery, which include but are not limited to bleeding, infection, damage to nearby organs including bowel, bladder, ureter. She accepts blood transfusion as indicated. Discussed immediate and at-home recovery and activity restrictions. All questions answered.  Carlyon Shadow 01/10/2023,  6:37 AM

## 2023-01-11 NOTE — Anesthesia Preprocedure Evaluation (Signed)
Anesthesia Evaluation  Patient identified by MRN, date of birth, ID band Patient awake    Reviewed: Allergy & Precautions, NPO status , Patient's Chart, lab work & pertinent test results  History of Anesthesia Complications Negative for: history of anesthetic complications  Airway Mallampati: I  TM Distance: >3 FB Neck ROM: Full    Dental no notable dental hx. (+) Dental Advisory Given   Pulmonary neg pulmonary ROS   Pulmonary exam normal        Cardiovascular hypertension, Pt. on medications Normal cardiovascular exam     Neuro/Psych negative neurological ROS     GI/Hepatic negative GI ROS, Neg liver ROS,,,  Endo/Other  negative endocrine ROS    Renal/GU negative Renal ROS     Musculoskeletal negative musculoskeletal ROS (+)    Abdominal   Peds  Hematology negative hematology ROS (+)   Anesthesia Other Findings   Reproductive/Obstetrics                             Anesthesia Physical Anesthesia Plan  ASA: 2  Anesthesia Plan: General   Post-op Pain Management: Tylenol PO (pre-op)* and Toradol IV (intra-op)*   Induction: Intravenous  PONV Risk Score and Plan: 4 or greater and Ondansetron, Dexamethasone, Midazolam and Scopolamine patch - Pre-op  Airway Management Planned: Oral ETT  Additional Equipment:   Intra-op Plan:   Post-operative Plan: Extubation in OR  Informed Consent: I have reviewed the patients History and Physical, chart, labs and discussed the procedure including the risks, benefits and alternatives for the proposed anesthesia with the patient or authorized representative who has indicated his/her understanding and acceptance.     Dental advisory given  Plan Discussed with: Anesthesiologist and CRNA  Anesthesia Plan Comments:         Anesthesia Quick Evaluation

## 2023-01-13 ENCOUNTER — Observation Stay (HOSPITAL_BASED_OUTPATIENT_CLINIC_OR_DEPARTMENT_OTHER)
Admission: RE | Admit: 2023-01-13 | Discharge: 2023-01-14 | Disposition: A | Discharge status: Home / Self Care | Payer: 59 | Attending: Obstetrics and Gynecology | Admitting: Obstetrics and Gynecology

## 2023-01-13 ENCOUNTER — Observation Stay (HOSPITAL_BASED_OUTPATIENT_CLINIC_OR_DEPARTMENT_OTHER): Payer: 59 | Admitting: Anesthesiology

## 2023-01-13 ENCOUNTER — Encounter (HOSPITAL_BASED_OUTPATIENT_CLINIC_OR_DEPARTMENT_OTHER)
Admission: RE | Disposition: A | Discharge status: Home / Self Care | Payer: Self-pay | Source: Home / Self Care | Attending: Obstetrics and Gynecology

## 2023-01-13 ENCOUNTER — Encounter (HOSPITAL_BASED_OUTPATIENT_CLINIC_OR_DEPARTMENT_OTHER): Payer: Self-pay | Admitting: Obstetrics and Gynecology

## 2023-01-13 ENCOUNTER — Other Ambulatory Visit: Payer: Self-pay

## 2023-01-13 DIAGNOSIS — N83202 Unspecified ovarian cyst, left side: Secondary | ICD-10-CM

## 2023-01-13 DIAGNOSIS — Z01818 Encounter for other preprocedural examination: Secondary | ICD-10-CM

## 2023-01-13 DIAGNOSIS — N8003 Adenomyosis of the uterus: Secondary | ICD-10-CM | POA: Diagnosis not present

## 2023-01-13 DIAGNOSIS — N939 Abnormal uterine and vaginal bleeding, unspecified: Secondary | ICD-10-CM | POA: Diagnosis present

## 2023-01-13 DIAGNOSIS — I10 Essential (primary) hypertension: Secondary | ICD-10-CM | POA: Insufficient documentation

## 2023-01-13 DIAGNOSIS — D259 Leiomyoma of uterus, unspecified: Secondary | ICD-10-CM

## 2023-01-13 HISTORY — PX: HYSTERECTOMY ABDOMINAL WITH SALPINGECTOMY: SHX6725

## 2023-01-13 HISTORY — DX: Essential (primary) hypertension: I10

## 2023-01-13 LAB — TYPE AND SCREEN
ABO/RH(D): A POS
Antibody Screen: NEGATIVE

## 2023-01-13 LAB — POCT PREGNANCY, URINE: Preg Test, Ur: NEGATIVE

## 2023-01-13 SURGERY — HYSTERECTOMY, TOTAL, ABDOMINAL, WITH SALPINGECTOMY
Anesthesia: General | Site: Pelvis | Laterality: Bilateral

## 2023-01-13 MED ORDER — ONDANSETRON HCL 4 MG/2ML IJ SOLN
INTRAMUSCULAR | Status: AC
  Filled 2023-01-13: qty 2

## 2023-01-13 MED ORDER — PROPOFOL 10 MG/ML IV BOLUS
INTRAVENOUS | Status: AC
  Filled 2023-01-13: qty 20

## 2023-01-13 MED ORDER — CEFAZOLIN SODIUM-DEXTROSE 2-4 GM/100ML-% IV SOLN
INTRAVENOUS | Status: AC
  Filled 2023-01-13: qty 100

## 2023-01-13 MED ORDER — KETOROLAC TROMETHAMINE 30 MG/ML IJ SOLN
INTRAMUSCULAR | Status: DC | PRN
  Administered 2023-01-13: 30 mg via INTRAVENOUS

## 2023-01-13 MED ORDER — ONDANSETRON HCL 4 MG/2ML IJ SOLN
INTRAMUSCULAR | Status: DC | PRN
  Administered 2023-01-13: 4 mg via INTRAVENOUS

## 2023-01-13 MED ORDER — ACETAMINOPHEN 325 MG PO TABS
ORAL_TABLET | ORAL | Status: AC
  Filled 2023-01-13: qty 2

## 2023-01-13 MED ORDER — IBUPROFEN 200 MG PO TABS
600.0000 mg | ORAL_TABLET | Freq: Four times a day (QID) | ORAL | Status: DC
  Administered 2023-01-13 – 2023-01-14 (×3): 600 mg via ORAL

## 2023-01-13 MED ORDER — SIMETHICONE 80 MG PO CHEW: 80.0000 mg | CHEWABLE_TABLET | Freq: Four times a day (QID) | ORAL | Status: DC | PRN

## 2023-01-13 MED ORDER — POVIDONE-IODINE 10 % EX SWAB: 2.0000 | Freq: Once | CUTANEOUS | Status: DC

## 2023-01-13 MED ORDER — OXYCODONE HCL 5 MG PO TABS
ORAL_TABLET | ORAL | Status: AC
  Filled 2023-01-13: qty 2

## 2023-01-13 MED ORDER — FENTANYL CITRATE (PF) 100 MCG/2ML IJ SOLN
INTRAMUSCULAR | Status: AC
  Filled 2023-01-13: qty 2

## 2023-01-13 MED ORDER — FENTANYL CITRATE (PF) 100 MCG/2ML IJ SOLN
25.0000 ug | INTRAMUSCULAR | Status: DC | PRN
  Administered 2023-01-13: 50 ug via INTRAVENOUS
  Administered 2023-01-13: 25 ug via INTRAVENOUS

## 2023-01-13 MED ORDER — DEXTROSE-NACL 5-0.45 % IV SOLN: INTRAVENOUS | Status: DC

## 2023-01-13 MED ORDER — AMISULPRIDE (ANTIEMETIC) 5 MG/2ML IV SOLN
10.0000 mg | Freq: Once | INTRAVENOUS | Status: DC | PRN
  Administered 2023-01-13: 10 mg via INTRAVENOUS

## 2023-01-13 MED ORDER — ALUM & MAG HYDROXIDE-SIMETH 200-200-20 MG/5ML PO SUSP: 30.0000 mL | ORAL | Status: DC | PRN

## 2023-01-13 MED ORDER — SUGAMMADEX SODIUM 200 MG/2ML IV SOLN
INTRAVENOUS | Status: DC | PRN
  Administered 2023-01-13: 200 mg via INTRAVENOUS

## 2023-01-13 MED ORDER — OXYCODONE HCL 5 MG PO TABS
5.0000 mg | ORAL_TABLET | ORAL | Status: DC | PRN
  Administered 2023-01-13 – 2023-01-14 (×5): 10 mg via ORAL

## 2023-01-13 MED ORDER — GLYCOPYRROLATE 0.2 MG/ML IJ SOLN
INTRAMUSCULAR | Status: DC | PRN
  Administered 2023-01-13: .2 mg via INTRAVENOUS

## 2023-01-13 MED ORDER — PROPOFOL 10 MG/ML IV BOLUS
INTRAVENOUS | Status: DC | PRN
  Administered 2023-01-13: 200 mg via INTRAVENOUS

## 2023-01-13 MED ORDER — LIDOCAINE 2% (20 MG/ML) 5 ML SYRINGE
INTRAMUSCULAR | Status: DC | PRN
  Administered 2023-01-13: 100 mg via INTRAVENOUS

## 2023-01-13 MED ORDER — ROCURONIUM BROMIDE 10 MG/ML (PF) SYRINGE
PREFILLED_SYRINGE | INTRAVENOUS | Status: AC
  Filled 2023-01-13: qty 10

## 2023-01-13 MED ORDER — ZOLPIDEM TARTRATE 5 MG PO TABS: 5.0000 mg | ORAL_TABLET | Freq: Every evening | ORAL | Status: DC | PRN

## 2023-01-13 MED ORDER — ROCURONIUM BROMIDE 10 MG/ML (PF) SYRINGE
PREFILLED_SYRINGE | INTRAVENOUS | Status: DC | PRN
  Administered 2023-01-13: 80 mg via INTRAVENOUS

## 2023-01-13 MED ORDER — MIDAZOLAM HCL 2 MG/2ML IJ SOLN
INTRAMUSCULAR | Status: AC
  Filled 2023-01-13: qty 2

## 2023-01-13 MED ORDER — MIDAZOLAM HCL 5 MG/5ML IJ SOLN
INTRAMUSCULAR | Status: DC | PRN
  Administered 2023-01-13: 2 mg via INTRAVENOUS

## 2023-01-13 MED ORDER — PHENYLEPHRINE 80 MCG/ML (10ML) SYRINGE FOR IV PUSH (FOR BLOOD PRESSURE SUPPORT)
PREFILLED_SYRINGE | INTRAVENOUS | Status: DC | PRN
  Administered 2023-01-13: 160 ug via INTRAVENOUS

## 2023-01-13 MED ORDER — IBUPROFEN 200 MG PO TABS
ORAL_TABLET | ORAL | Status: AC
  Filled 2023-01-13: qty 3

## 2023-01-13 MED ORDER — SCOPOLAMINE 1 MG/3DAYS TD PT72
1.0000 | MEDICATED_PATCH | TRANSDERMAL | Status: DC
  Administered 2023-01-13: 1.5 mg via TRANSDERMAL

## 2023-01-13 MED ORDER — CEFAZOLIN SODIUM-DEXTROSE 2-4 GM/100ML-% IV SOLN
2.0000 g | INTRAVENOUS | Status: AC
  Administered 2023-01-13: 2 g via INTRAVENOUS

## 2023-01-13 MED ORDER — HYDROCHLOROTHIAZIDE 12.5 MG PO TABS
12.5000 mg | ORAL_TABLET | Freq: Every day | ORAL | Status: DC
  Administered 2023-01-13: 12.5 mg via ORAL
  Filled 2023-01-13: qty 1

## 2023-01-13 MED ORDER — LIDOCAINE HCL (PF) 2 % IJ SOLN
INTRAMUSCULAR | Status: AC
  Filled 2023-01-13: qty 5

## 2023-01-13 MED ORDER — PROMETHAZINE HCL 25 MG/ML IJ SOLN: 6.2500 mg | INTRAMUSCULAR | Status: DC | PRN

## 2023-01-13 MED ORDER — SODIUM CHLORIDE (PF) 0.9 % IJ SOLN
INTRAMUSCULAR | Status: DC | PRN
  Administered 2023-01-13: 20 mL

## 2023-01-13 MED ORDER — MENTHOL 3 MG MT LOZG: 1.0000 | LOZENGE | OROMUCOSAL | Status: DC | PRN

## 2023-01-13 MED ORDER — ACETAMINOPHEN 325 MG PO TABS: 650.0000 mg | ORAL_TABLET | ORAL | 0 refills | Status: AC | PRN

## 2023-01-13 MED ORDER — DOCUSATE SODIUM 100 MG PO CAPS
100.0000 mg | ORAL_CAPSULE | Freq: Two times a day (BID) | ORAL | Status: DC
  Administered 2023-01-13 (×2): 100 mg via ORAL

## 2023-01-13 MED ORDER — GLYCOPYRROLATE PF 0.2 MG/ML IJ SOSY
PREFILLED_SYRINGE | INTRAMUSCULAR | Status: AC
  Filled 2023-01-13: qty 1

## 2023-01-13 MED ORDER — ACETAMINOPHEN 500 MG PO TABS
1000.0000 mg | ORAL_TABLET | Freq: Four times a day (QID) | ORAL | Status: DC | PRN
  Administered 2023-01-13: 1000 mg via ORAL

## 2023-01-13 MED ORDER — DOCUSATE SODIUM 100 MG PO CAPS
ORAL_CAPSULE | ORAL | Status: AC
  Filled 2023-01-13: qty 1

## 2023-01-13 MED ORDER — AMISULPRIDE (ANTIEMETIC) 5 MG/2ML IV SOLN
INTRAVENOUS | Status: AC
  Filled 2023-01-13: qty 4

## 2023-01-13 MED ORDER — DEXAMETHASONE SODIUM PHOSPHATE 10 MG/ML IJ SOLN
INTRAMUSCULAR | Status: DC | PRN
  Administered 2023-01-13: 5 mg via INTRAVENOUS

## 2023-01-13 MED ORDER — LOSARTAN POTASSIUM 50 MG PO TABS
50.0000 mg | ORAL_TABLET | Freq: Every day | ORAL | Status: DC
  Administered 2023-01-13: 50 mg via ORAL
  Filled 2023-01-13: qty 1

## 2023-01-13 MED ORDER — ACETAMINOPHEN 325 MG PO TABS
650.0000 mg | ORAL_TABLET | ORAL | Status: DC | PRN
  Administered 2023-01-13 – 2023-01-14 (×4): 650 mg via ORAL

## 2023-01-13 MED ORDER — ACETAMINOPHEN 500 MG PO TABS
ORAL_TABLET | ORAL | Status: AC
  Filled 2023-01-13: qty 2

## 2023-01-13 MED ORDER — LACTATED RINGERS IV SOLN: INTRAVENOUS | Status: DC

## 2023-01-13 MED ORDER — PHENYLEPHRINE 80 MCG/ML (10ML) SYRINGE FOR IV PUSH (FOR BLOOD PRESSURE SUPPORT)
PREFILLED_SYRINGE | INTRAVENOUS | Status: AC
  Filled 2023-01-13: qty 10

## 2023-01-13 MED ORDER — DOCUSATE SODIUM 100 MG PO CAPS: 100.0000 mg | ORAL_CAPSULE | Freq: Two times a day (BID) | ORAL | 0 refills | Status: AC

## 2023-01-13 MED ORDER — LABETALOL HCL 5 MG/ML IV SOLN
10.0000 mg | Freq: Once | INTRAVENOUS | Status: AC | PRN
  Administered 2023-01-13: 10 mg via INTRAVENOUS

## 2023-01-13 MED ORDER — FENTANYL CITRATE (PF) 250 MCG/5ML IJ SOLN
INTRAMUSCULAR | Status: AC
  Filled 2023-01-13: qty 5

## 2023-01-13 MED ORDER — OXYCODONE HCL 5 MG PO TABS: 5.0000 mg | ORAL_TABLET | ORAL | 0 refills | Status: AC | PRN

## 2023-01-13 MED ORDER — FENTANYL CITRATE (PF) 100 MCG/2ML IJ SOLN
INTRAMUSCULAR | Status: DC | PRN
  Administered 2023-01-13 (×2): 50 ug via INTRAVENOUS
  Administered 2023-01-13: 100 ug via INTRAVENOUS

## 2023-01-13 MED ORDER — IBUPROFEN 600 MG PO TABS: 600.0000 mg | ORAL_TABLET | Freq: Four times a day (QID) | ORAL | 0 refills | Status: AC

## 2023-01-13 MED ORDER — LABETALOL HCL 5 MG/ML IV SOLN
INTRAVENOUS | Status: AC
  Filled 2023-01-13: qty 4

## 2023-01-13 MED ORDER — BUPIVACAINE LIPOSOME 1.3 % IJ SUSP
INTRAMUSCULAR | Status: DC | PRN
  Administered 2023-01-13: 20 mL

## 2023-01-13 MED ORDER — 0.9 % SODIUM CHLORIDE (POUR BTL) OPTIME
TOPICAL | Status: DC | PRN
  Administered 2023-01-13: 2000 mL

## 2023-01-13 MED ORDER — DEXAMETHASONE SODIUM PHOSPHATE 10 MG/ML IJ SOLN
INTRAMUSCULAR | Status: AC
  Filled 2023-01-13: qty 1

## 2023-01-13 MED ORDER — SCOPOLAMINE 1 MG/3DAYS TD PT72
MEDICATED_PATCH | TRANSDERMAL | Status: AC
  Filled 2023-01-13: qty 1

## 2023-01-13 SURGICAL SUPPLY — 45 items
APL SKNCLS STERI-STRIP NONHPOA (GAUZE/BANDAGES/DRESSINGS) ×1
APL SWBSTK 6 STRL LF DISP (MISCELLANEOUS) ×2
APPLICATOR COTTON TIP 6 STRL (MISCELLANEOUS) IMPLANT
APPLICATOR COTTON TIP 6IN STRL (MISCELLANEOUS) ×2
BENZOIN TINCTURE PRP APPL 2/3 (GAUZE/BANDAGES/DRESSINGS) IMPLANT
BLADE SURG 10 STRL SS (BLADE) IMPLANT
DRAPE WARM FLUID 44X44 (DRAPES) IMPLANT
DRSG OPSITE POSTOP 4X10 (GAUZE/BANDAGES/DRESSINGS) ×1 IMPLANT
DURAPREP 26ML APPLICATOR (WOUND CARE) ×1 IMPLANT
GAUZE 4X4 16PLY ~~LOC~~+RFID DBL (SPONGE) IMPLANT
GLOVE BIO SURGEON STRL SZ7 (GLOVE) IMPLANT
GLOVE BIOGEL PI IND STRL 6 (GLOVE) IMPLANT
GLOVE BIOGEL PI IND STRL 7.0 (GLOVE) ×2 IMPLANT
GLOVE ECLIPSE 7.0 STRL STRAW (GLOVE) ×1 IMPLANT
GOWN STRL REUS W/TWL LRG LVL3 (GOWN DISPOSABLE) ×3 IMPLANT
HIBICLENS CHG 4% 4OZ BTL (MISCELLANEOUS) ×1 IMPLANT
KIT TURNOVER CYSTO (KITS) ×1 IMPLANT
LIGASURE IMPACT 36 18CM CVD LR (INSTRUMENTS) IMPLANT
NDL HYPO 22X1.5 SAFETY MO (MISCELLANEOUS) ×1 IMPLANT
NEEDLE HYPO 22X1.5 SAFETY MO (MISCELLANEOUS) ×1 IMPLANT
NEEDLE SAFETY HYPO 22GAX1.5 (MISCELLANEOUS) ×1
NS IRRIG 1000ML POUR BTL (IV SOLUTION) ×1 IMPLANT
PACK ABDOMINAL GYN (CUSTOM PROCEDURE TRAY) ×1 IMPLANT
PAD ARMBOARD 7.5X6 YLW CONV (MISCELLANEOUS) ×1 IMPLANT
PAD OB MATERNITY 4.3X12.25 (PERSONAL CARE ITEMS) ×1 IMPLANT
SLEEVE SCD COMPRESS KNEE MED (STOCKING) ×1 IMPLANT
SPIKE FLUID TRANSFER (MISCELLANEOUS) ×2 IMPLANT
SPONGE T-LAP 18X18 ~~LOC~~+RFID (SPONGE) IMPLANT
STRIP CLOSURE SKIN 1/2X4 (GAUZE/BANDAGES/DRESSINGS) IMPLANT
SUT MNCRL 0 1X36 CT-1 (SUTURE) ×1 IMPLANT
SUT MNCRL 0 MO-4 VIOLET 18 CR (SUTURE) ×3 IMPLANT
SUT MNCRL 0 VIOLET 6X18 (SUTURE) ×1 IMPLANT
SUT MONOCRYL 0 (SUTURE) ×1
SUT MONOCRYL 0 6X18 (SUTURE) ×1
SUT MONOCRYL 0 MO 4 18  CR/8 (SUTURE) ×4
SUT PDS AB 0 CTX 60 (SUTURE) ×2 IMPLANT
SUT PLAIN 2 0 (SUTURE) ×1
SUT PLAIN 2 0 XLH (SUTURE) IMPLANT
SUT PLAIN ABS 2-0 CT1 27XMFL (SUTURE) IMPLANT
SUT VIC AB 4-0 KS 27 (SUTURE) IMPLANT
SUT VIC AB 4-0 PS2 27 (SUTURE) IMPLANT
SYR CONTROL 10ML LL (SYRINGE) IMPLANT
TOWEL OR 17X24 6PK STRL BLUE (TOWEL DISPOSABLE) ×2 IMPLANT
TRAY FOLEY W/BAG SLVR 14FR LF (SET/KITS/TRAYS/PACK) ×1 IMPLANT
YANKAUER SUCT BULB TIP NO VENT (SUCTIONS) IMPLANT

## 2023-01-13 NOTE — Anesthesia Postprocedure Evaluation (Signed)
Anesthesia Post Note  Patient: Carmen Simmons  Procedure(s) Performed: TOTAL ABDOMINAL HYSTERECTOMY AND LEFT OVARIAN CYSTECTOMY (Bilateral: Pelvis)     Patient location during evaluation: PACU Anesthesia Type: General Level of consciousness: sedated Pain management: pain level controlled Vital Signs Assessment: post-procedure vital signs reviewed and stable Respiratory status: spontaneous breathing and respiratory function stable Cardiovascular status: stable Postop Assessment: no apparent nausea or vomiting Anesthetic complications: no  No notable events documented.  Last Vitals:  Vitals:   01/13/23 1100 01/13/23 1115  BP: (!) 137/91 (!) 127/90  Pulse: 88 81  Resp: 19 15  Temp:    SpO2: 93% 93%    Last Pain:  Vitals:   01/13/23 1115  TempSrc:   PainSc: 8                  Khalis Hittle DANIEL

## 2023-01-13 NOTE — Op Note (Addendum)
Operative Note  PREOPERATIVE DIAGNOSES: 1. Abnormal uterine bleeding 2. Uterine fibroids 3. Suspected adenomyosis 4. Prior abdominoplasty and Cesarean section   POSTOPERATIVE DIAGNOSES: Same, plus left ovarian cyst (5 cm) Specimen weight 319 grams  PROCEDURE PERFORMED: Total abdominal hysterectomy with left ovarian cystectomy.   SURGEON: Dr. Alpha Gula ASSISTANT: Dr. Arvella Nigh  ANESTHESIA: General   ESTIMATED BLOOD LOSS: 100 cc.  URINE OUTPUT: 300 cc of clear urine at the end of the procedure.  COMPLICATIONS: None   TUBES: None.  DRAINS: Foley to gravity.  PATHOLOGY: Uterus, cervix, left ovarian cyst  FINDINGS: On exam, under anesthesia, normal appearing vulva and vagina. Prior abdominoplasty.  S/p bilateral salpingectomy  Operative findings demonstrated significant scar tissue of the abdominal wall. An enlarged uterus, s/p bilateral salpingectomy.  Simple appearing left ovarian cyst 5 cm.  Procedure: The patient was prepped and draped in the usual sterile manner for an abdominal procedure. A pfannenstiel incision was made  carried down to the underlying fascia. Dens scar tissue and obscured planes were noted from prior surgery. Fascia was incised in the midline and extended bilaterally with mayo scissors.  The abdominal cavity was entered safely, no bowel adhesions noted.  Bladder adhesions were noted and avoided. Underlying rectus muscles were separated from the fascia superiorly and inferiorly in the usual fashion. Peritoneum was entered sharply with hemostat and metz with good visualization of the bladder and bowel. Once inside the abdominal cavity, the uterus was then identified and grasped with upward traction. The round ligaments on either side were identified and individually dissected and ligated with #0 Monocryl suture and divided. This allowed [place] to then create a bladder flap by both blunt and sharp dissection.   Minimal tubal remnant was noted, she is s/p  bilateral salpingectomy for her sterilization procedure.   The uteroovarian ligament were isolated through the broad ligament from the uterine body and ligated and then fixated with 0 monocryl and tied with a free tie as well.    We then skeletonized the uterine vessels on either side and carefully dissected the bladder flap anteriorly. Posteriorly, the peritoneum was dissected down toward the uterosacral ligaments. Heaney clamps were then placed at each isthmic portion of the cervical body junction where the uterine arteries adjoined the uterus. These were clamped, ligated and divided using #0 Monocryl suture. The remainder of the uterus was then removed by the clamp-cut-ligation technique using #0 Vicryl on all major pedicles. With removal of the uterus, the vaginal cuff was closed with two haney stitches and then with serial figure of either stiches using #0 monocryl. Hemostasis was then inspected and secured throughout the entire area including the vaginal cuff, all pedicles, and the bladder.   The left ovary was noted to have a 5 cm simple appearing cyst. This was drained, incised, and cyst wall removed with blunt and sharp dissection. This was sent for pathology. The remaining ovarian tissue was then re approximated with running monocryl.   The lap sponges were then removed and once again inspected for hemostasis.  The small bowel was also briefly run and no injury noted. The patient tolerated the operation well. There were no complications associated with this surgical procedure to this point. The sponge count was correct times 2 at this time. The Foley catheter was inspected and clear urine was noted. Having removed all instruments and packs, we then began closure of the abdomen. The fascia was closed with #0 loop PDS in a running continuous manner and the subcutaneous tissue  was in a  running continuous manner of plain gunt and vicryl for a total of three layers. Exparel was placed, 40 cc Hemostasis  was secured throughout the entire layers. The incision was then closed as noted in the above operative findings. The patient tolerated the operation nicely and was then taken to the Recovery Room in good condition.    Alpha Gula MD

## 2023-01-13 NOTE — Anesthesia Procedure Notes (Signed)
Procedure Name: Intubation Date/Time: 01/13/2023 7:34 AM  Performed by: Rogers Blocker, CRNAPre-anesthesia Checklist: Patient identified, Emergency Drugs available, Suction available and Patient being monitored Patient Re-evaluated:Patient Re-evaluated prior to induction Oxygen Delivery Method: Circle System Utilized Preoxygenation: Pre-oxygenation with 100% oxygen Induction Type: IV induction Ventilation: Mask ventilation without difficulty Laryngoscope Size: Mac and 3 Grade View: Grade I Tube type: Oral Tube size: 7.0 mm Number of attempts: 1 Airway Equipment and Method: Stylet and Bite block Placement Confirmation: ETT inserted through vocal cords under direct vision, positive ETCO2 and breath sounds checked- equal and bilateral Secured at: 22 cm Tube secured with: Tape Dental Injury: Teeth and Oropharynx as per pre-operative assessment

## 2023-01-13 NOTE — Progress Notes (Signed)
No changes to H&P.  BP (!) 149/99   Pulse 79   Temp 98.2 F (36.8 C) (Oral)   Resp 18   Ht '5\' 10"'$  (1.778 m)   Wt 113.9 kg   LMP 12/25/2022 (Exact Date)   SpO2 96%   BMI 36.01 kg/m   All questions answered today, discussed risks and benefits of procedure as well as recovery. Patient was counseled on the risks of TAH BS, which include but are not limited to bleeding, infection, damage to nearby organs including bowel/bladder/ureter, need for additional procedure or blood transfusion, and postoperative pain.  Patient agreeable to procedure, all questions answered.   Alpha Gula MD

## 2023-01-13 NOTE — Transfer of Care (Signed)
Immediate Anesthesia Transfer of Care Note  Patient: Carmen Simmons  Procedure(s) Performed: TOTAL ABDOMINAL HYSTERECTOMY AND LEFT OVARIAN CYSTECTOMY (Bilateral: Pelvis)  Patient Location: PACU  Anesthesia Type:General  Level of Consciousness: awake and patient cooperative  Airway & Oxygen Therapy: Patient Spontanous Breathing and Patient connected to face mask oxygen  Post-op Assessment: Report given to RN and Post -op Vital signs reviewed and stable  Post vital signs: Reviewed and stable  Last Vitals:  Vitals Value Taken Time  BP 150/98 01/13/23 0945  Temp 36.9 C 01/13/23 0945  Pulse 88 01/13/23 0948  Resp 9 01/13/23 0948  SpO2 100 % 01/13/23 0948  Vitals shown include unvalidated device data.  Last Pain:  Vitals:   01/13/23 0606  TempSrc: Oral  PainSc: 0-No pain      Patients Stated Pain Goal: 5 ([id number])  Complications: No notable events documented.

## 2023-01-14 ENCOUNTER — Encounter (HOSPITAL_BASED_OUTPATIENT_CLINIC_OR_DEPARTMENT_OTHER): Payer: Self-pay | Admitting: Obstetrics and Gynecology

## 2023-01-14 DIAGNOSIS — N939 Abnormal uterine and vaginal bleeding, unspecified: Secondary | ICD-10-CM | POA: Diagnosis not present

## 2023-01-14 LAB — COMPREHENSIVE METABOLIC PANEL
ALT: 19 U/L (ref 0–44)
AST: 21 U/L (ref 15–41)
Albumin: 4 g/dL (ref 3.5–5.0)
Alkaline Phosphatase: 49 U/L (ref 38–126)
Anion gap: 9 (ref 5–15)
BUN: 7 mg/dL (ref 6–20)
CO2: 26 mmol/L (ref 22–32)
Calcium: 8.6 mg/dL — ABNORMAL LOW (ref 8.9–10.3)
Chloride: 98 mmol/L (ref 98–111)
Creatinine, Ser: 0.8 mg/dL (ref 0.44–1.00)
GFR, Estimated: >60 mL/min (ref >60)
Glucose, Bld: 124 mg/dL — ABNORMAL HIGH (ref 70–99)
Potassium: 3.4 mmol/L — ABNORMAL LOW (ref 3.5–5.1)
Sodium: 133 mmol/L — ABNORMAL LOW (ref 135–145)
Total Bilirubin: 0.6 mg/dL (ref 0.3–1.2)
Total Protein: 7.4 g/dL (ref 6.5–8.1)

## 2023-01-14 LAB — CBC
HCT: 36.1 % (ref 36.0–46.0)
Hemoglobin: 11.3 g/dL — ABNORMAL LOW (ref 12.0–15.0)
MCH: 25.7 pg — ABNORMAL LOW (ref 26.0–34.0)
MCHC: 31.3 g/dL (ref 30.0–36.0)
MCV: 82 fL (ref 80.0–100.0)
Platelets: 188 10*3/uL (ref 150–400)
RBC: 4.4 MIL/uL (ref 3.87–5.11)
RDW: 17 % — ABNORMAL HIGH (ref 11.5–15.5)
WBC: 12 10*3/uL — ABNORMAL HIGH (ref 4.0–10.5)
nRBC: 0 % (ref 0.0–0.2)

## 2023-01-14 LAB — SURGICAL PATHOLOGY

## 2023-01-14 MED ORDER — ACETAMINOPHEN 325 MG PO TABS
ORAL_TABLET | ORAL | Status: AC
  Filled 2023-01-14: qty 2

## 2023-01-14 MED ORDER — IBUPROFEN 200 MG PO TABS
ORAL_TABLET | ORAL | Status: AC
  Filled 2023-01-14: qty 3

## 2023-01-14 MED ORDER — OXYCODONE HCL 5 MG PO TABS
ORAL_TABLET | ORAL | Status: AC
  Filled 2023-01-14: qty 2

## 2023-01-14 NOTE — Progress Notes (Signed)
Gynecology Progress Note  Postoperative day #1 s/p TAH with left ovarian cystectomy.  Subjective:  Patient reports no overnight events.  She reports well controlled pain, ambulating without difficulty, voiding spontaneously, tolerating PO.  She reports Negative flatus, Negative BM. Denies nausea, distension.  Objective: Blood pressure (!) 101/53, pulse 89, temperature 98.3 F (36.8 C), resp. rate (!) 22, height '5\' 10"'$  (1.778 m), weight 113.9 kg, last menstrual period 12/25/2022, SpO2 96 %.  Physical Exam:   Gen: alert, well appearing, no distress Chest: nonlabored breathing CV: no peripheral edema Abdomen: soft, nondistended, ATTP Incision: honeycomb in place with minimal shadowing Ext: no evidence of DVT   Recent Labs    01/14/23 0124  HGB 11.3*  HCT 36.1    Assessment/Plan: POD#1 s/p TAH with left ovarian cystectomy Pain much better controlled since yesterday.  Patient looks well UO adequate.  Labs reassuring.  HTN - continue home medications.  1x 10 mg labetalol given yesterday for elevated BP.  Suspect improvement with better pain control and resumption of home meds. Recovery, restrictions, and medications discussed. Meeting milestones this morning, but awaiting flatus. Discussed return precautions. Anticipate discharge today.    LOS: 0 days   Carlyon Shadow 01/14/2023, 8:28 AM

## 2023-01-14 NOTE — Discharge Summary (Signed)
Gynecology Discharge Summary  Carmen Simmons is a 47 y.o. female that presented on 01/13/2023 for TAH.  She underwent her procedure as planned with addition of left ovarian cystectomy after discovery of 5 cm left ovarian cyst. Her postoperative course was uncomplicated.  On POD#1, she reported well controlled pain, ambulating without difficulty, voiding spontaneously.  She was discharged home in stable condition with plans for in-office follow up.   Hemoglobin  Date Value Ref Range Status  01/14/2023 11.3 (L) 12.0 - 15.0 g/dL Final   HCT  Date Value Ref Range Status  01/14/2023 36.1 36.0 - 46.0 % Final    Physical Exam:  Gen: alert, well appearing, no distress Chest: nonlabored breathing CV: no peripheral edema Abdomen: soft, nondistended, ATTP Ext: no evidence of DVT  Discharge Diagnoses: AUB, fibroids, left ovarian cyst, s/p TAH with left ovarian cystectomy  Discharge Information: Date: 01/14/2023 Activity: Pelvic rest, as tolerated Diet: routine Medications: Tylenol, motrin, oxycodone, colace Condition: stable Instructions: Discussed prior to discharge.  Discharge to: [place] Follow up.   Why: Please follow up for a postoperative visit. The office will reach out to schedule. Contact information: [place] [address] [phone]                  Carlyon Shadow 01/14/2023, 3:59 PM

## 2023-03-12 ENCOUNTER — Telehealth: Payer: Self-pay | Admitting: Family Medicine

## 2023-03-12 NOTE — Telephone Encounter (Signed)
Called to make video appointment for referral, no answer left vm to call P[facility] .

## 2023-03-12 NOTE — Telephone Encounter (Signed)
Copied from CRM [id number]. Topic: Referral - Request for Referral >> Mar 11, 2023  2:05 PM Franchot Heidelberg wrote: Has patient seen PCP for this complaint? No. *If NO, is insurance requiring patient see PCP for this issue before PCP can refer them? Referral for which specialty: Sleep Study  Preferred provider/office: [facility]  Reason for referral: Pt's husband says that pt stops breathing in her sleep

## 2023-04-03 ENCOUNTER — Other Ambulatory Visit: Payer: Self-pay | Admitting: Family Medicine

## 2023-04-17 ENCOUNTER — Encounter: Payer: Self-pay | Admitting: Family Medicine

## 2023-04-17 ENCOUNTER — Telehealth (INDEPENDENT_AMBULATORY_CARE_PROVIDER_SITE_OTHER): Payer: 59 | Admitting: Family Medicine

## 2023-04-17 DIAGNOSIS — R0683 Snoring: Secondary | ICD-10-CM

## 2023-04-17 DIAGNOSIS — I10 Essential (primary) hypertension: Secondary | ICD-10-CM | POA: Diagnosis not present

## 2023-04-17 MED ORDER — LOSARTAN POTASSIUM 100 MG PO TABS: 100.0000 mg | ORAL_TABLET | Freq: Every day | ORAL | 1 refills | Status: DC

## 2023-04-17 NOTE — Progress Notes (Signed)
Virtual Visit via Video Note  I connected with Carmen Simmons on 04/17/23 at 11:00 AM EDT by a video enabled telemedicine application and verified that I am speaking with the correct person using two identifiers.  Location: Patient: Carmen Simmons Provider: [place]   I discussed the limitations of evaluation and management by telemedicine and the availability of in person appointments. The patient expressed understanding and agreed to proceed.  History of Present Illness: Patient would like to continue taking 100mg  losartan as she believes her blood pressures are better and have stabilized. Patient also reports that she has been snoring. Spouse reports some "apnea" episodes and patient is tired during the day.    Observations/Objective:   Assessment and Plan: 1. Snoring Will refer for sleep study.  2. Essential hypertension Will continue with losartan 100mg  daily   Follow Up Instructions: Make follow up after sleep study as required/recommended.    I discussed the assessment and treatment plan with the patient. The patient was provided an opportunity to ask questions and all were answered. The patient agreed with the plan and demonstrated an understanding of the instructions.   The patient was advised to call back or seek an in-person evaluation if the symptoms worsen or if the condition fails to improve as anticipated.  I provided 7 minutes of non-face-to-face time during this encounter.   Carmen Raymond, MD

## 2023-04-30 ENCOUNTER — Other Ambulatory Visit: Payer: Self-pay | Admitting: Family Medicine

## 2023-06-11 ENCOUNTER — Telehealth: Payer: Self-pay | Admitting: Family Medicine

## 2023-06-11 NOTE — Telephone Encounter (Signed)
Called Carmen Simmons back with no answer from [facility] Sleep study

## 2023-06-11 NOTE — Telephone Encounter (Signed)
Danae with [facility] is calling in because they received a referral for a sleep study for pt and it was denied because it doesn't meet the requirements.

## 2023-06-28 ENCOUNTER — Other Ambulatory Visit: Payer: Self-pay | Admitting: Family Medicine

## 2023-06-30 ENCOUNTER — Other Ambulatory Visit: Payer: Self-pay

## 2023-06-30 DIAGNOSIS — I10 Essential (primary) hypertension: Secondary | ICD-10-CM

## 2023-06-30 MED ORDER — HYDROCHLOROTHIAZIDE 12.5 MG PO TABS: 12.5000 mg | ORAL_TABLET | Freq: Every day | ORAL | 0 refills | Status: DC

## 2023-07-11 ENCOUNTER — Telehealth: Payer: Self-pay | Admitting: Family Medicine

## 2023-07-11 NOTE — Telephone Encounter (Signed)
Referral Request - Has patient seen PCP for this complaint? No. *If NO, is insurance requiring patient see PCP for this issue before PCP can refer them? Referral for which specialty: mammogram Preferred provider/office: Breast mammogram Reason for referral: pt has not had a mammogram that she can remember

## 2023-07-17 NOTE — Telephone Encounter (Signed)
Patient needs to schedule for routine physical

## 2023-07-18 NOTE — Telephone Encounter (Signed)
Called pt and left vm to call office back to schedule physical appt.

## 2023-07-18 NOTE — Telephone Encounter (Signed)
Patient need to schedule CPE

## 2023-10-04 ENCOUNTER — Other Ambulatory Visit: Payer: Self-pay | Admitting: Family Medicine

## 2023-10-04 DIAGNOSIS — I10 Essential (primary) hypertension: Secondary | ICD-10-CM

## 2023-10-08 ENCOUNTER — Ambulatory Visit: Payer: Self-pay | Admitting: *Deleted

## 2023-10-08 NOTE — Telephone Encounter (Signed)
  Chief Complaint: chest tightness, elevated BP  Symptoms: chest tightness, comes and goes esp with deep breathing. Has not been taking cozaar and restarted 3 days ago . BP prior to call 161/116 recheck for BP 160/115 pulse 77. Headache blurred vision reported. Balance "a little off" at times.  Frequency: couple of days  Pertinent Negatives: Patient denies denies worsening chest pain no difficulty breathing no weakness on either side of body no N/T face . No dizziness .  Disposition: [x] ED /[] Urgent Care (no appt availability in office) / [] Appointment(In office/virtual)/ []  [facility]/ [] Home Care/ [] Refused Recommended Disposition /[] [facility]/ []  Follow-up with PCP Additional Notes:   Recommended ED due to sx. Patient would like call from PCP if other options appropriate. Will increase water intake and monitor sodium content in food. Please advise if medication adjustments can be considered. Unsure if patient will go to ED now.          Reason for Disposition  [1] Systolic BP  >= 160 OR Diastolic >= 100 AND [2] cardiac (e.g., breathing difficulty, chest pain) or neurologic symptoms (e.g., new-onset blurred or double vision, unsteady gait)  Answer Assessment - Initial Assessment Questions 1. BLOOD PRESSURE: "What is the blood pressure?" "Did you take at least two measurements 5 minutes apart?"     Prior to call BP 161/116 2. ONSET: "When did you take your blood pressure?"     Now  3. HOW: "How did you take your blood pressure?" (e.g., automatic home BP monitor, visiting nurse)     Automatic home monitor 4. HISTORY: "Do you have a history of high blood pressure?"     Yes  5. MEDICINES: "Are you taking any medicines for blood pressure?" "Have you missed any doses recently?"     Yes has missed doses prior to last 3 days  6. OTHER SYMPTOMS: "Do you have any symptoms?" (e.g., blurred vision, chest pain, difficulty breathing, headache, weakness)     Chest  tightness , headaches , blurred vision,  7. PREGNANCY: "Is there any chance you are pregnant?" "When was your last menstrual period?"     na  Protocols used: Blood Pressure - High-A-AH

## 2023-10-09 ENCOUNTER — Emergency Department (HOSPITAL_COMMUNITY): Payer: 59

## 2023-10-09 ENCOUNTER — Encounter (HOSPITAL_COMMUNITY): Payer: Self-pay | Admitting: Emergency Medicine

## 2023-10-09 ENCOUNTER — Emergency Department (HOSPITAL_COMMUNITY)
Admission: EM | Admit: 2023-10-09 | Discharge: 2023-10-09 | Disposition: A | Discharge status: Home / Self Care | Payer: 59 | Attending: Emergency Medicine | Admitting: Emergency Medicine

## 2023-10-09 DIAGNOSIS — R079 Chest pain, unspecified: Secondary | ICD-10-CM | POA: Diagnosis present

## 2023-10-09 DIAGNOSIS — Z79899 Other long term (current) drug therapy: Secondary | ICD-10-CM | POA: Diagnosis not present

## 2023-10-09 DIAGNOSIS — R030 Elevated blood-pressure reading, without diagnosis of hypertension: Secondary | ICD-10-CM

## 2023-10-09 DIAGNOSIS — I10 Essential (primary) hypertension: Secondary | ICD-10-CM | POA: Diagnosis not present

## 2023-10-09 LAB — CBC WITH DIFFERENTIAL/PLATELET
Abs Immature Granulocytes: 0.01 10*3/uL (ref 0.00–0.07)
Basophils Absolute: 0 10*3/uL (ref 0.0–0.1)
Basophils Relative: 1 %
Eosinophils Absolute: 0.1 10*3/uL (ref 0.0–0.5)
Eosinophils Relative: 2 %
HCT: 42.6 % (ref 36.0–46.0)
Hemoglobin: 13.4 g/dL (ref 12.0–15.0)
Immature Granulocytes: 0 %
Lymphocytes Relative: 46 %
Lymphs Abs: 2.5 10*3/uL (ref 0.7–4.0)
MCH: 26.6 pg (ref 26.0–34.0)
MCHC: 31.5 g/dL (ref 30.0–36.0)
MCV: 84.5 fL (ref 80.0–100.0)
Monocytes Absolute: 0.3 10*3/uL (ref 0.1–1.0)
Monocytes Relative: 5 %
Neutro Abs: 2.5 10*3/uL (ref 1.7–7.7)
Neutrophils Relative %: 46 %
Platelets: 222 10*3/uL (ref 150–400)
RBC: 5.04 MIL/uL (ref 3.87–5.11)
RDW: 14.3 % (ref 11.5–15.5)
WBC: 5.5 10*3/uL (ref 4.0–10.5)
nRBC: 0 % (ref 0.0–0.2)

## 2023-10-09 LAB — BASIC METABOLIC PANEL
Anion gap: 9 (ref 5–15)
BUN: 13 mg/dL (ref 6–20)
CO2: 24 mmol/L (ref 22–32)
Calcium: 9.1 mg/dL (ref 8.9–10.3)
Chloride: 102 mmol/L (ref 98–111)
Creatinine, Ser: 0.65 mg/dL (ref 0.44–1.00)
GFR, Estimated: >60 mL/min (ref >60)
Glucose, Bld: 94 mg/dL (ref 70–99)
Potassium: 3.7 mmol/L (ref 3.5–5.1)
Sodium: 135 mmol/L (ref 135–145)

## 2023-10-09 LAB — TROPONIN I (HIGH SENSITIVITY)
Troponin I (High Sensitivity): 3 ng/L (ref <18)
Troponin I (High Sensitivity): <2 ng/L (ref <18)

## 2023-10-09 NOTE — Discharge Instructions (Signed)
You were seen in the ER today for evaluation of your elevated blood pressure and chest pain. I would like for you to follow up with a cardiologist and your PCP. Please call to schedule an appointment. Make sure that you are taking your BP medication daily as prescribed. You can also set a timer on your phone to help remind you. I have included a form for you to write the numbers down. I have included more information on this into the discharge paperwork, please review. If you have any concerns, new or worsening symptoms, please return to the ER for re-evaluation.   Contact a doctor if: Your chest pain does not go away. You feel depressed. You have a fever. Get help right away if: Your chest pain is worse. You have a cough that gets worse, or you cough up blood. You have very bad (severe) pain in your belly (abdomen). You pass out (faint). You have either of these for no clear reason: Sudden chest discomfort. Sudden discomfort in your arms, back, neck, or jaw. You have shortness of breath at any time. You suddenly start to sweat, or your skin gets clammy. You feel sick to your stomach (nauseous). You throw up (vomit). You suddenly feel lightheaded or dizzy. You feel very weak or tired. Your heart starts to beat fast, or it feels like it is skipping beats. These symptoms may be an emergency. Do not wait to see if the symptoms will go away. Get medical help right away. Call your local emergency services (911 in the [place] Do not drive yourself to the hospital.

## 2023-10-09 NOTE — ED Triage Notes (Signed)
Pt. Stated, I've had some chest tightness, blurred vision, and my BP has been high all week.  I called my Dr. And they said to come over here.

## 2023-10-09 NOTE — ED Provider Notes (Signed)
[place] EMERGENCY DEPARTMENT AT [facility] Provider Note   CSN: [id number] Arrival date & time: 10/09/23  1002     History Chief Complaint  Patient presents with   Chest Pain   Hypertension   Blurred Vision    Carmen Simmons is a 47 y.o. female with h/o HTN presents to the ER for evaluation of elevated BP and chest pain. The patient reports that for over a week she has been having some waxing and waning central chest pain. She describes it as dull, non radiating. Worse with some movements such as lifting up her arms. Occasionally feels it worsening on exertion. Denies denies any SOB or DOE. Denies any palpitations.  She reports that she previously was not compliant with her losartan but has been taking it every day for the past week.  She reports that she has been checking her BP every 2 hours and saw the BP of 160s systollically and called her PCP who encouraged her to come in because she was having CP. Additionally, she reports has been having a headache off and on but currently is not complaining of a headache.  She mentioned blurry vision in the triage note.  She reports that she had a blurry vision episode once when she was standing, lasted a few minutes and about the rise and then resolved.  Denies any trouble walking or talking.  No recent fevers or chills.  No cough.  Did have some lightheadedness with the standing up with a blurry vision but no room spinning sensation.  She denies any shortness of breath or any leg swelling.  She denies any OCP use, history of cancer, PE/DVT history, hemoptysis, long travel, or recent surgeries.  Denies any family history of blood clots. Denies any trauma to the chest.  She denies any tobacco, EtOH, illicit drug use.   Chest Pain Associated symptoms: headache (none now)   Associated symptoms: no abdominal pain, no cough, no dizziness, no fever, no nausea, no palpitations, no shortness of breath and no vomiting   Hypertension Associated  symptoms include chest pain and headaches (none now). Pertinent negatives include no abdominal pain and no shortness of breath.       Home Medications Prior to Admission medications   Medication Sig Start Date End Date Taking? Authorizing Provider  acetaminophen (TYLENOL) 325 MG tablet Take 2 tablets (650 mg total) by mouth every 4 (four) hours as needed for mild pain (temperature > 101.5.). 01/13/23   Lyn Henri, MD  cetirizine (ZYRTEC) 10 MG tablet Take by mouth. 11/26/17   [provider]  diphenhydrAMINE (BENADRYL) 50 MG capsule Take 50 mg by mouth every 6 (six) hours as needed. For environmental allergies.    [provider]  docusate sodium (COLACE) 100 MG capsule Take 1 capsule (100 mg total) by mouth 2 (two) times daily. 01/13/23   Lyn Henri, MD  EPINEPHrine (EPIPEN 2-PAK) 0.3 mg/0.3 mL IJ SOAJ injection     [provider]  fluticasone (FLONASE) 50 MCG/ACT nasal spray 1 spray as needed for rhinitis. 11/26/17   [provider]  hydrochlorothiazide (HYDRODIURIL) 12.5 MG tablet TAKE 1 TABLET BY MOUTH EVERY DAY 06/30/23   Georganna Skeans, MD  hydrochlorothiazide (HYDRODIURIL) 12.5 MG tablet TAKE 1 TABLET BY MOUTH EVERY DAY 10/06/23   Georganna Skeans, MD  ibuprofen (ADVIL) 600 MG tablet Take 1 tablet (600 mg total) by mouth every 6 (six) hours. 01/13/23   Lyn Henri, MD  losartan (COZAAR) 100  MG tablet Take 1 tablet (100 mg total) by mouth daily. 04/17/23   Georganna Skeans, MD  oxyCODONE (OXY IR/ROXICODONE) 5 MG immediate release tablet Take 1 tablet (5 mg total) by mouth every 4 (four) hours as needed for moderate pain. 01/13/23   Lyn Henri, MD  SUMAtriptan (IMITREX) 100 MG tablet TAKE 1 TABLET (100 MG TOTAL) BY MOUTH EVERY 2 (TWO) HOURS AS NEEDED FOR MIGRAINE. MAY REPEAT IN 2 HOURS IF HEADACHE PERSISTS OR RECURS. 05/09/23   Rema Fendt, NP      Allergies    Dust mite extract and Other    Review of Systems   Review of Systems   Constitutional:  Negative for chills and fever.  Eyes:  Positive for visual disturbance (none now).  Respiratory:  Negative for cough and shortness of breath.   Cardiovascular:  Positive for chest pain. Negative for palpitations and leg swelling.  Gastrointestinal:  Negative for abdominal pain, constipation, diarrhea, nausea and vomiting.  Musculoskeletal:  Negative for gait problem.  Neurological:  Positive for light-headedness (one episode, none now) and headaches (none now). Negative for dizziness, syncope and speech difficulty.    Physical Exam Updated Vital Signs BP (!) 142/93   Pulse 73   Temp 98.3 F (36.8 C)   Resp 18   Ht 5\' 9"  (1.753 m)   Wt 111.1 kg   LMP 12/25/2022 (Exact Date)   SpO2 100%   BMI 36.18 kg/m  Physical Exam Constitutional:      General: She is not in acute distress.    Appearance: She is not ill-appearing, toxic-appearing or diaphoretic.  Eyes:     General: No visual field deficit or scleral icterus.    Extraocular Movements: Extraocular movements intact.     Pupils: Pupils are equal, round, and reactive to light.  Cardiovascular:     Rate and Rhythm: Normal rate.     Pulses:          Radial pulses are 2+ on the right side and 2+ on the left side.       Dorsalis pedis pulses are 2+ on the right side and 2+ on the left side.       Posterior tibial pulses are 2+ on the right side and 2+ on the left side.  Pulmonary:     Effort: Pulmonary effort is normal.     Breath sounds: No decreased breath sounds.  Chest:     Chest wall: No tenderness.  Abdominal:     Palpations: Abdomen is soft.     Tenderness: There is no abdominal tenderness. There is no guarding or rebound.  Musculoskeletal:     Right lower leg: No tenderness. No edema.     Left lower leg: No tenderness. No edema.  Skin:    General: Skin is warm and dry.  Neurological:     General: No focal deficit present.     Mental Status: She is alert.     GCS: GCS eye subscore is 4. GCS verbal  subscore is 5. GCS motor subscore is 6.     Cranial Nerves: Cranial nerves 2-12 are intact. No cranial nerve deficit, dysarthria or facial asymmetry.     Sensory: No sensory deficit.     Motor: No weakness or pronator drift.     ED Results / Procedures / Treatments   Labs (all labs ordered are listed, but only abnormal results are displayed) Labs Reviewed  CBC WITH DIFFERENTIAL/PLATELET  BASIC METABOLIC PANEL  CBC  TROPONIN  I (HIGH SENSITIVITY)  TROPONIN I (HIGH SENSITIVITY)    EKG EKG Interpretation Date/Time:  Thursday October 09 2023 10:37:56 EST Ventricular Rate:  79 PR Interval:  188 QRS Duration:  90 QT Interval:  400 QTC Calculation: 458 R Axis:   -16  Text Interpretation: Sinus rhythm with occasional Premature ventricular complexes Anterior infarct , age undetermined No significant change since last tracing When compared with ECG of 24-Oct-2022 09:37, PREVIOUS ECG IS PRESENT Confirmed by Gwyneth Sprout ([id number]) on 10/09/2023 4:52:56 PM  Radiology DG Chest 2 View  Result Date: 10/09/2023 CLINICAL DATA:  Chest pain. EXAM: CHEST - 2 VIEW COMPARISON:  None Available. FINDINGS: The heart size and mediastinal contours are within normal limits. Both lungs are clear. The visualized skeletal structures are unremarkable. IMPRESSION: No active cardiopulmonary disease. Electronically Signed   By: Lupita Raider M.D.   On: 10/09/2023 11:54    Procedures Procedures   Medications Ordered in ED Medications - No data to display  ED Course/ Medical Decision Making/ A&P   Medical Decision Making Amount and/or Complexity of Data Reviewed Labs: ordered. Radiology: ordered.   47 y.o. female presents to the ER for evaluation of elevated BP and chest pain. Differential diagnosis includes but is not limited to ACS, pericarditis, myocarditis, aortic dissection, PE, pneumothorax, esophageal spasm or rupture, chronic angina, pneumonia, bronchitis, GERD, reflux/PUD, biliary disease,  pancreatitis, costochondritis, anxiety, hypertension, hypertensive urgency/emergency. Vital signs BP 142/93, otherwise unremarkable. Physical exam as noted above.   I independently reviewed and interpreted the patient's labs.  BMP unremarkable.  CBC without leukocytosis or anemia.  Initial troponin 3 with repeat at less than 2. The patient is PERC negative, less likely PE.   CXR shows no active cardiopulmonary disease.  EKG reviewed and interpreted by my attending and read as Sinus rhythm with occasional Premature ventricular complexes Anterior infarct , age undetermined No significant change since last tracing.  Given flat troponins and reassuring EKG, less likely ACS.  No recent cough or cold symptoms, does not have any proven with leaning forward.  Doubt any pericarditis.  Exam is not typical presentation for dissection or aneurysm.  She is PERC negative without any family history.  She is satting at 100% on room air, normal heart rate.  Lungs are clear to auscultation, chest x-ray does not show any signs of pneumonia or pneumothorax.  Denies any abdominal pain or nausea or vomiting.  Doubt any intra-abdominal cause.  Could be possible MSK in nature given it does worsen with some movement.  She reports she has some possible exertional factor to it however is not there consistently.  Will give her that ration for a cardiologist to follow-up with.  Recommended she follow-up with her primary care doctor as well.  We discussed the need for compliance with her medications.  I provided her a blood pressure form for her to fill out her readings on.  We discussed need to your blood pressure once maybe twice a day.  Her blood pressure is also improved and is now 142/93 which is minutely elevated.  She is stable for discharge home with close outpatient follow-up.  We discussed the results of the labs/imaging. The plan is take medications as prescribed, follow-up PCP, follow-up cardiology. We discussed strict  return precautions and red flag symptoms. The patient verbalized their understanding and agrees to the plan. The patient is stable and being discharged home in good condition.  Portions of this report may have been transcribed using voice recognition software. Every  effort was made to ensure accuracy; however, inadvertent computerized transcription errors may be present.   Final Clinical Impression(s) / ED Diagnoses Final diagnoses:  Nonspecific chest pain  Elevated blood pressure reading    Rx / DC Orders ED Discharge Orders          Ordered    Ambulatory referral to Cardiology        10/09/23 1730              Achille Rich, Cordelia Poche 10/09/23 1731    Gwyneth Sprout, MD 10/09/23 2243

## 2024-02-06 ENCOUNTER — Other Ambulatory Visit: Payer: Self-pay | Admitting: Family Medicine

## 2024-04-12 ENCOUNTER — Telehealth: Payer: Self-pay | Admitting: Family Medicine

## 2024-04-12 NOTE — Telephone Encounter (Unsigned)
 Copied from CRM [id number]. Topic: Clinical - Medication Refill >> Apr 12, 2024 10:31 AM Tiffany S wrote: Medication: losartan  (COZAAR ) 100 MG tablet [[id number]]  Has the patient contacted their pharmacy? Yes (Agent: If no, request that the patient contact the pharmacy for the refill. If patient does not wish to contact the pharmacy document the reason why and proceed with request.) (Agent: If yes, when and what did the pharmacy advise?)  This is the patient's preferred pharmacy:   [facility] [phone] [place], [place] - [address] [place] [address] Phone: [phone] Fax: [phone]  Is this the correct pharmacy for this prescription? Yes If no, delete pharmacy and type the correct one.   Has the prescription been filled recently? Yes  Is the patient out of the medication? Yes  Has the patient been seen for an appointment in the last year OR does the patient have an upcoming appointment? Yes  Can we respond through MyChart? Yes  Agent: Please be advised that Rx refills may take up to 3 business days. We ask that you follow-up with your pharmacy.

## 2024-04-19 ENCOUNTER — Other Ambulatory Visit: Payer: Self-pay | Admitting: Family Medicine

## 2024-04-19 MED ORDER — LOSARTAN POTASSIUM 100 MG PO TABS: 100.0000 mg | ORAL_TABLET | Freq: Every day | ORAL | 0 refills | Status: DC

## 2024-04-19 NOTE — Telephone Encounter (Signed)
 Copied from CRM [id number]. Topic: Clinical - Medication Refill >> Apr 19, 2024  9:20 AM Loreda Rodriguez T wrote: Medication: losartan  (COZAAR ) 100 MG tablet  Has the patient contacted their pharmacy? Yes  This is the patient's preferred pharmacy: [id number] [place], [place] - [address] Phone: [phone]  Fax: [phone]     Is this the correct pharmacy for this prescription? Yes  Has the prescription been filled recently? Yes  Is the patient out of the medication? Yes  Has the patient been seen for an appointment in the last year OR does the patient have an upcoming appointment? Yes  Can we respond through MyChart? No  Agent: Please be advised that Rx refills may take up to 3 business days. We ask that you follow-up with your pharmacy.  Patient put in the med refil request on Monday 6/9 but was never sent to provider. Patient is completely out of meds. Can she get her refill asap?

## 2024-04-19 NOTE — Telephone Encounter (Signed)
 Patient called, left VM to return the call to the office to schedule an OV. Her last visit was 04/17/23, physical is needed.

## 2024-04-19 NOTE — Telephone Encounter (Signed)
 Courtesy refill given, appointment needed.   Requested Prescriptions  Pending Prescriptions Disp Refills   losartan  (COZAAR ) 100 MG tablet 30 tablet 0    Sig: Take 1 tablet (100 mg total) by mouth daily. OFFICE VISIT NEEDED FOR ADDITIONAL REFILLS     Cardiovascular:  Angiotensin Receptor Blockers Failed - 04/19/2024  9:50 AM      Failed - Cr in normal range and within 180 days    Creatinine, Ser  Date Value Ref Range Status  10/09/2023 0.65 0.44 - 1.00 mg/dL Final   Creatinine, Urine  Date Value Ref Range Status  09/25/2010 59.1 mg/dL Final         Failed - K in normal range and within 180 days    Potassium  Date Value Ref Range Status  10/09/2023 3.7 3.5 - 5.1 mmol/L Final         Failed - Last BP in normal range    BP Readings from Last 1 Encounters:  10/09/23 (!) 154/88         Failed - Valid encounter within last 6 months    Recent Outpatient Visits           1 year ago Snoring   [facility], MD   1 year ago Essential hypertension   [facility], MD   1 year ago Essential hypertension   [facility], MD              Passed - Patient is not pregnant

## 2024-05-11 ENCOUNTER — Other Ambulatory Visit: Payer: Self-pay | Admitting: Family Medicine

## 2024-05-19 ENCOUNTER — Encounter: Payer: Self-pay | Admitting: Family Medicine

## 2024-05-19 ENCOUNTER — Ambulatory Visit (INDEPENDENT_AMBULATORY_CARE_PROVIDER_SITE_OTHER): Admitting: Family Medicine

## 2024-05-19 VITALS — BP 143/106 | HR 69 | Wt 254.6 lb

## 2024-05-19 DIAGNOSIS — Z1159 Encounter for screening for other viral diseases: Secondary | ICD-10-CM

## 2024-05-19 DIAGNOSIS — Z13228 Encounter for screening for other metabolic disorders: Secondary | ICD-10-CM

## 2024-05-19 DIAGNOSIS — Z13220 Encounter for screening for lipoid disorders: Secondary | ICD-10-CM | POA: Diagnosis not present

## 2024-05-19 DIAGNOSIS — Z130 Encounter for screening for diseases of the blood and blood-forming organs and certain disorders involving the immune mechanism: Secondary | ICD-10-CM

## 2024-05-19 DIAGNOSIS — Z0000 Encounter for general adult medical examination without abnormal findings: Secondary | ICD-10-CM | POA: Diagnosis not present

## 2024-05-19 DIAGNOSIS — Z1329 Encounter for screening for other suspected endocrine disorder: Secondary | ICD-10-CM

## 2024-05-19 MED ORDER — LOSARTAN POTASSIUM-HCTZ 100-25 MG PO TABS: 1.0000 | ORAL_TABLET | Freq: Every day | ORAL | 0 refills | Status: DC

## 2024-05-19 NOTE — Progress Notes (Signed)
 Established Patient Office Visit  Subjective    Patient ID: Carmen Simmons, female    DOB: Jan 04, 1976  Age: 48 y.o. MRN: [id number]  CC:  Chief Complaint  Patient presents with   Annual Exam    Pt wondering difference between gout and arthritis. Pt concerned she is retaining fluid, believes it has something to do with her medication     HPI Carmen Simmons presents for routine annual exam. Patient reports med compliance and denies acute complaints.   Outpatient Encounter Medications as of 05/19/2024  Medication Sig   diphenhydrAMINE  (BENADRYL ) 50 MG capsule Take 50 mg by mouth every 6 (six) hours as needed. For environmental allergies.   losartan -hydrochlorothiazide  (HYZAAR) 100-25 MG tablet Take 1 tablet by mouth daily.   acetaminophen  (TYLENOL ) 325 MG tablet Take 2 tablets (650 mg total) by mouth every 4 (four) hours as needed for mild pain (temperature > 101.5.). (Patient not taking: Reported on 05/19/2024)   cetirizine (ZYRTEC) 10 MG tablet Take by mouth. (Patient not taking: Reported on 05/19/2024)   docusate sodium  (COLACE) 100 MG capsule Take 1 capsule (100 mg total) by mouth 2 (two) times daily. (Patient not taking: Reported on 05/19/2024)   EPINEPHrine  (EPIPEN  2-PAK) 0.3 mg/0.3 mL IJ SOAJ injection    fluticasone (FLONASE) 50 MCG/ACT nasal spray 1 spray as needed for rhinitis.   hydrochlorothiazide  (HYDRODIURIL ) 12.5 MG tablet TAKE 1 TABLET BY MOUTH EVERY DAY (Patient not taking: Reported on 05/19/2024)   hydrochlorothiazide  (HYDRODIURIL ) 12.5 MG tablet TAKE 1 TABLET BY MOUTH EVERY DAY (Patient not taking: Reported on 05/19/2024)   ibuprofen  (ADVIL ) 600 MG tablet Take 1 tablet (600 mg total) by mouth every 6 (six) hours. (Patient not taking: Reported on 05/19/2024)   losartan  (COZAAR ) 100 MG tablet Take 1 tablet (100 mg total) by mouth daily. OFFICE VISIT NEEDED FOR ADDITIONAL REFILLS   oxyCODONE  (OXY IR/ROXICODONE ) 5 MG immediate release tablet Take 1 tablet (5 mg total) by mouth  every 4 (four) hours as needed for moderate pain. (Patient not taking: Reported on 05/19/2024)   SUMAtriptan  (IMITREX ) 100 MG tablet TAKE 1 TABLET (100 MG TOTAL) BY MOUTH EVERY 2 (TWO) HOURS AS NEEDED FOR MIGRAINE. MAY REPEAT IN 2 HOURS IF HEADACHE PERSISTS OR RECURS. (Patient not taking: Reported on 05/19/2024)   No facility-administered encounter medications on file as of 05/19/2024.    Past Medical History:  Diagnosis Date   Allergy    seasonal allergies   Anemia    with pregnancy   Arrhythmia    remote hx of supraventricular tachycardia about 20 years ago as of 10/22/22   Arthritis    right knee   Chronic headache    stress related - otc meds prn   Complication of anesthesia    Patient states that it takes her a little longer to wake up.   Eczema    Hypertension    Follows w/ PCP, Dr. Raguel Blush @ [facility].   Pneumonia    about 2003   Wears glasses     Past Surgical History:  Procedure Laterality Date   ABDOMINOPLASTY  2022   CESAREAN SECTION  2011   x 1 - twins   ganglion cyst removal Right 2018   knee   HYSTERECTOMY ABDOMINAL WITH SALPINGECTOMY Bilateral 01/13/2023   Procedure: TOTAL ABDOMINAL HYSTERECTOMY AND LEFT OVARIAN CYSTECTOMY;  Surgeon: Lequita Evalene LABOR, MD;  Location: [facility];  Service: Gynecology;  Laterality: Bilateral;   IUD REMOVAL N/A 08/09/2015  Procedure: INTRAUTERINE DEVICE (IUD) REMOVAL;  Surgeon: Ovid All, MD;  Location: [facility];  Service: Gynecology;  Laterality: N/A;   LAPAROSCOPIC BILATERAL SALPINGECTOMY Bilateral 08/09/2015   Procedure: LAPAROSCOPIC BILATERAL SALPINGECTOMY;  Surgeon: Ovid All, MD;  Location: [facility];  Service: Gynecology;  Laterality: Bilateral;   WISDOM TOOTH EXTRACTION  2002    Family History  Problem Relation Age of Onset   Hypertension Mother     Social History   Socioeconomic History   Marital status: Married    Spouse name: Not on file   Number of children: Not on  file   Years of education: Not on file   Highest education level: Not on file  Occupational History   Not on file  Tobacco Use   Smoking status: Never   Smokeless tobacco: Never  Vaping Use   Vaping status: Never Used  Substance and Sexual Activity   Alcohol use: Not Currently   Drug use: No   Sexual activity: Not on file  Other Topics Concern   Not on file  Social History Narrative   Not on file   Social Drivers of Health   Financial Resource Strain: Not on file  Food Insecurity: Not on file  Transportation Needs: Not on file  Physical Activity: Not on file  Stress: Not on file  Social Connections: Not on file  Intimate Partner Violence: Not on file    Review of Systems  All other systems reviewed and are negative.       Objective    BP (!) 143/106   Pulse 69   Wt 254 lb 9.6 oz (115.5 kg)   LMP 12/25/2022 (Exact Date)   SpO2 95%   BMI 37.60 kg/m   Physical Exam Vitals and nursing note reviewed.  Constitutional:      General: She is not in acute distress.    Appearance: She is obese.  HENT:     Head: Normocephalic and atraumatic.     Right Ear: Tympanic membrane, ear canal and external ear normal.     Left Ear: Tympanic membrane, ear canal and external ear normal.     Nose: Nose normal.     Mouth/Throat:     Mouth: Mucous membranes are moist.     Pharynx: Oropharynx is clear.  Eyes:     Conjunctiva/sclera: Conjunctivae normal.     Pupils: Pupils are equal, round, and reactive to light.  Neck:     Thyroid: No thyromegaly.  Cardiovascular:     Rate and Rhythm: Normal rate and regular rhythm.     Heart sounds: Normal heart sounds. No murmur heard. Pulmonary:     Effort: Pulmonary effort is normal. No respiratory distress.     Breath sounds: Normal breath sounds.  Abdominal:     General: There is no distension.     Palpations: Abdomen is soft. There is no mass.     Tenderness: There is no abdominal tenderness.  Musculoskeletal:        General:  Normal range of motion.     Cervical back: Normal range of motion and neck supple.  Skin:    General: Skin is warm and dry.  Neurological:     General: No focal deficit present.     Mental Status: She is alert and oriented to person, place, and time.  Psychiatric:        Mood and Affect: Mood normal.        Behavior: Behavior normal.  Assessment & Plan:   Annual physical exam -     CMP14+EGFR  Screening for deficiency anemia -     CBC with Differential/Platelet  Screening for lipid disorders -     Lipid panel  Screening for endocrine/metabolic/immunity disorders -     VITAMIN D  25 Hydroxy (Vit-D Deficiency, Fractures) -     Hemoglobin A1c -     TSH  Need for hepatitis C screening test -     Hepatitis C antibody  Other orders -     Losartan  Potassium-HCTZ; Take 1 tablet by mouth daily.  Dispense: 90 tablet; Refill: 0     Return in about 6 months (around 11/19/2024).   Tanda Raguel SQUIBB, MD

## 2024-05-20 LAB — CMP14+EGFR
ALT: 15 IU/L (ref 0–32)
AST: 16 IU/L (ref 0–40)
Albumin: 4.2 g/dL (ref 3.9–4.9)
Alkaline Phosphatase: 66 IU/L (ref 44–121)
BUN/Creatinine Ratio: 19 (ref 9–23)
BUN: 8 mg/dL (ref 6–24)
Bilirubin Total: 0.3 mg/dL (ref 0.0–1.2)
CO2: 20 mmol/L (ref 20–29)
Calcium: 8.7 mg/dL (ref 8.7–10.2)
Chloride: 100 mmol/L (ref 96–106)
Creatinine, Ser: 0.42 mg/dL — ABNORMAL LOW (ref 0.57–1.00)
Globulin, Total: 2.9 g/dL (ref 1.5–4.5)
Glucose: 81 mg/dL (ref 70–99)
Potassium: 3.8 mmol/L (ref 3.5–5.2)
Sodium: 137 mmol/L (ref 134–144)
Total Protein: 7.1 g/dL (ref 6.0–8.5)
eGFR: 121 mL/min/1.73 (ref >59)

## 2024-05-20 LAB — CBC WITH DIFFERENTIAL/PLATELET
Basophils Absolute: 0 x10E3/uL (ref 0.0–0.2)
Basos: 0 %
EOS (ABSOLUTE): 0.1 x10E3/uL (ref 0.0–0.4)
Eos: 1 %
Hematocrit: 40.3 % (ref 34.0–46.6)
Hemoglobin: 12.9 g/dL (ref 11.1–15.9)
Immature Grans (Abs): 0 x10E3/uL (ref 0.0–0.1)
Immature Granulocytes: 0 %
Lymphocytes Absolute: 2.4 x10E3/uL (ref 0.7–3.1)
Lymphs: 43 %
MCH: 28 pg (ref 26.6–33.0)
MCHC: 32 g/dL (ref 31.5–35.7)
MCV: 88 fL (ref 79–97)
Monocytes Absolute: 0.3 x10E3/uL (ref 0.1–0.9)
Monocytes: 6 %
Neutrophils Absolute: 2.9 x10E3/uL (ref 1.4–7.0)
Neutrophils: 50 %
Platelets: 178 x10E3/uL (ref 150–450)
RBC: 4.6 x10E6/uL (ref 3.77–5.28)
RDW: 13.3 % (ref 11.7–15.4)
WBC: 5.7 x10E3/uL (ref 3.4–10.8)

## 2024-05-20 LAB — LIPID PANEL
Chol/HDL Ratio: 3.3 ratio (ref 0.0–4.4)
Cholesterol, Total: 206 mg/dL — ABNORMAL HIGH (ref 100–199)
HDL: 62 mg/dL (ref >39)
LDL Chol Calc (NIH): 122 mg/dL — ABNORMAL HIGH (ref 0–99)
Triglycerides: 127 mg/dL (ref 0–149)
VLDL Cholesterol Cal: 22 mg/dL (ref 5–40)

## 2024-05-20 LAB — VITAMIN D 25 HYDROXY (VIT D DEFICIENCY, FRACTURES): Vit D, 25-Hydroxy: 9.6 ng/mL — ABNORMAL LOW (ref 30.0–100.0)

## 2024-05-20 LAB — HEMOGLOBIN A1C
Est. average glucose Bld gHb Est-mCnc: 123 mg/dL
Hgb A1c MFr Bld: 5.9 % — ABNORMAL HIGH (ref 4.8–5.6)

## 2024-05-20 LAB — TSH: TSH: 1.14 u[IU]/mL (ref 0.450–4.500)

## 2024-05-20 LAB — HEPATITIS C ANTIBODY: Hep C Virus Ab: NONREACTIVE

## 2024-05-24 ENCOUNTER — Ambulatory Visit: Payer: Self-pay | Admitting: Family Medicine

## 2024-05-24 MED ORDER — VITAMIN D (ERGOCALCIFEROL) 1.25 MG (50000 UNIT) PO CAPS: 50000.0000 [IU] | ORAL_CAPSULE | ORAL | 0 refills | Status: DC

## 2024-05-25 ENCOUNTER — Encounter: Admitting: Family Medicine

## 2024-08-14 ENCOUNTER — Other Ambulatory Visit: Payer: Self-pay | Admitting: Family Medicine

## 2024-08-15 ENCOUNTER — Other Ambulatory Visit: Payer: Self-pay | Admitting: Family Medicine

## 2024-11-16 ENCOUNTER — Other Ambulatory Visit: Payer: Self-pay | Admitting: Family Medicine

## 2024-11-23 ENCOUNTER — Encounter: Payer: Self-pay | Admitting: Family Medicine

## 2024-11-23 ENCOUNTER — Ambulatory Visit: Admitting: Family Medicine

## 2024-11-23 VITALS — BP 121/89 | HR 80 | Ht 69.0 in | Wt 250.6 lb

## 2024-11-23 DIAGNOSIS — Z6837 Body mass index (BMI) 37.0-37.9, adult: Secondary | ICD-10-CM

## 2024-11-23 DIAGNOSIS — E66812 Obesity, class 2: Secondary | ICD-10-CM

## 2024-11-23 DIAGNOSIS — I10 Essential (primary) hypertension: Secondary | ICD-10-CM | POA: Diagnosis not present

## 2024-11-23 DIAGNOSIS — E559 Vitamin D deficiency, unspecified: Secondary | ICD-10-CM

## 2024-11-23 DIAGNOSIS — N951 Menopausal and female climacteric states: Secondary | ICD-10-CM | POA: Diagnosis not present

## 2024-11-23 NOTE — Progress Notes (Unsigned)
 "  Established Patient Office Visit  Subjective    Patient ID: Carmen Simmons, female    DOB: July 01, 1976  Age: 49 y.o. MRN: [id number]  CC:  Chief Complaint  Patient presents with   Medical Management of Chronic Issues    Pt here for 6 month follow up. Pt would like to discuss perimenopause with Dr. Tanda - she reports noticing body temperature changes     HPI Carmen Simmons presents for follow up of hypertension. Patient reports that she has been compliant with meds. She also reports perimenopausal symptoms.  Outpatient Encounter Medications as of 11/23/2024  Medication Sig   diphenhydrAMINE  (BENADRYL ) 50 MG capsule Take 50 mg by mouth every 6 (six) hours as needed. For environmental allergies.   EPINEPHrine  (EPIPEN  2-PAK) 0.3 mg/0.3 mL IJ SOAJ injection    losartan -hydrochlorothiazide  (HYZAAR) 100-25 MG tablet TAKE 1 TABLET BY MOUTH EVERY DAY   acetaminophen  (TYLENOL ) 325 MG tablet Take 2 tablets (650 mg total) by mouth every 4 (four) hours as needed for mild pain (temperature > 101.5.). (Patient not taking: Reported on 05/19/2024)   cetirizine (ZYRTEC) 10 MG tablet Take by mouth. (Patient not taking: Reported on 05/19/2024)   docusate sodium  (COLACE) 100 MG capsule Take 1 capsule (100 mg total) by mouth 2 (two) times daily. (Patient not taking: Reported on 05/19/2024)   hydrochlorothiazide  (HYDRODIURIL ) 12.5 MG tablet TAKE 1 TABLET BY MOUTH EVERY DAY (Patient not taking: Reported on 05/19/2024)   hydrochlorothiazide  (HYDRODIURIL ) 12.5 MG tablet TAKE 1 TABLET BY MOUTH EVERY DAY (Patient not taking: Reported on 05/19/2024)   ibuprofen  (ADVIL ) 600 MG tablet Take 1 tablet (600 mg total) by mouth every 6 (six) hours. (Patient not taking: Reported on 05/19/2024)   losartan  (COZAAR ) 100 MG tablet Take 1 tablet (100 mg total) by mouth daily. OFFICE VISIT NEEDED FOR ADDITIONAL REFILLS (Patient not taking: Reported on 11/23/2024)   oxyCODONE  (OXY IR/ROXICODONE ) 5 MG immediate release tablet Take 1  tablet (5 mg total) by mouth every 4 (four) hours as needed for moderate pain. (Patient not taking: Reported on 05/19/2024)   SUMAtriptan  (IMITREX ) 100 MG tablet TAKE 1 TABLET (100 MG TOTAL) BY MOUTH EVERY 2 (TWO) HOURS AS NEEDED FOR MIGRAINE. MAY REPEAT IN 2 HOURS IF HEADACHE PERSISTS OR RECURS. (Patient not taking: Reported on 05/19/2024)   [DISCONTINUED] Vitamin D , Ergocalciferol , (DRISDOL ) 1.25 MG (50000 UNIT) CAPS capsule Take 1 capsule (50,000 Units total) by mouth every 7 (seven) days. (Patient not taking: Reported on 11/23/2024)   No facility-administered encounter medications on file as of 11/23/2024.    Past Medical History:  Diagnosis Date   Allergy    seasonal allergies   Anemia    with pregnancy   Arrhythmia    remote hx of supraventricular tachycardia about 20 years ago as of 10/22/22   Arthritis    right knee   Chronic headache    stress related - otc meds prn   Complication of anesthesia    Patient states that it takes her a little longer to wake up.   Eczema    Hypertension    Follows w/ PCP, Dr. Raguel Tanda @ [facility].   Pneumonia    about 2003   Wears glasses     Past Surgical History:  Procedure Laterality Date   ABDOMINOPLASTY  2022   CESAREAN SECTION  2011   x 1 - twins   ganglion cyst removal Right 2018   knee   HYSTERECTOMY ABDOMINAL WITH SALPINGECTOMY Bilateral  01/13/2023   Procedure: TOTAL ABDOMINAL HYSTERECTOMY AND LEFT OVARIAN CYSTECTOMY;  Surgeon: Lequita Evalene LABOR, MD;  Location: [facility];  Service: Gynecology;  Laterality: Bilateral;   IUD REMOVAL N/A 08/09/2015   Procedure: INTRAUTERINE DEVICE (IUD) REMOVAL;  Surgeon: Ovid All, MD;  Location: [facility];  Service: Gynecology;  Laterality: N/A;   LAPAROSCOPIC BILATERAL SALPINGECTOMY Bilateral 08/09/2015   Procedure: LAPAROSCOPIC BILATERAL SALPINGECTOMY;  Surgeon: Ovid All, MD;  Location: [facility];  Service: Gynecology;  Laterality: Bilateral;   WISDOM TOOTH  EXTRACTION  2002    Family History  Problem Relation Age of Onset   Hypertension Mother     Social History   Socioeconomic History   Marital status: Married    Spouse name: Not on file   Number of children: Not on file   Years of education: Not on file   Highest education level: Not on file  Occupational History   Not on file  Tobacco Use   Smoking status: Never   Smokeless tobacco: Never  Vaping Use   Vaping status: Never Used  Substance and Sexual Activity   Alcohol use: Not Currently   Drug use: No   Sexual activity: Not on file  Other Topics Concern   Not on file  Social History Narrative   Not on file   Social Drivers of Health   Tobacco Use: Low Risk (11/23/2024)   Patient History    Smoking Tobacco Use: Never    Smokeless Tobacco Use: Never    Passive Exposure: Not on file  Financial Resource Strain: Not on file  Food Insecurity: Not on file  Transportation Needs: Not on file  Physical Activity: Sufficiently Active (11/23/2024)   Exercise Vital Sign    Days of Exercise per Week: 5 days    Minutes of Exercise per Session: 60 min  Stress: Not on file  Social Connections: Not on file  Intimate Partner Violence: Not on file  Depression (PHQ2-9): Low Risk (01/02/2023)   Depression (PHQ2-9)    PHQ-2 Score: 2  Alcohol Screen: Not on file  Housing: Not on file  Utilities: Not on file  Health Literacy: Not on file    Review of Systems  All other systems reviewed and are negative.       Objective    BP 121/89   Pulse 80   Ht 5' 9 (1.753 m)   Wt 250 lb 9.6 oz (113.7 kg)   LMP 12/25/2022   SpO2 95%   BMI 37.01 kg/m   Physical Exam Vitals and nursing note reviewed.  Constitutional:      General: She is not in acute distress.    Appearance: She is obese.  Cardiovascular:     Rate and Rhythm: Normal rate and regular rhythm.  Pulmonary:     Effort: Pulmonary effort is normal.     Breath sounds: Normal breath sounds.  Abdominal:      Palpations: Abdomen is soft.     Tenderness: There is no abdominal tenderness.  Neurological:     General: No focal deficit present.     Mental Status: She is alert and oriented to person, place, and time.         Assessment & Plan:   1. Essential hypertension (Primary) Appears stable. Continue   2. Perimenopausal Discussed dietary and activity options. Patient defers specific Simmons at this time.   3. Vitamin D  deficiency Monitoring labs ordered.  - Vitamin D , 25-hydroxy  4. Class 2 severe obesity due to  excess calories with serious comorbidity and body mass index (BMI) of 37.0 to 37.9 in adult      No follow-ups on file.   Tanda Raguel SQUIBB, MD  "

## 2024-11-24 ENCOUNTER — Ambulatory Visit: Payer: Self-pay | Admitting: Family Medicine

## 2024-11-24 ENCOUNTER — Encounter: Payer: Self-pay | Admitting: Family Medicine

## 2024-11-24 LAB — VITAMIN D 25 HYDROXY (VIT D DEFICIENCY, FRACTURES): Vit D, 25-Hydroxy: 27.4 ng/mL — ABNORMAL LOW (ref 30.0–100.0)

## 2024-11-24 MED ORDER — VITAMIN D (ERGOCALCIFEROL) 1.25 MG (50000 UNIT) PO CAPS: 50000.0000 [IU] | ORAL_CAPSULE | ORAL | 0 refills | Status: AC
